# Patient Record
Sex: Male | Born: 1945 | Race: Black or African American | Hispanic: No | Marital: Single | Smoking: Never smoker
Health system: Southern US, Community
[De-identification: ages and names within clinical notes are randomized; demographics above are authoritative.]

## PROBLEM LIST (undated history)

## (undated) DIAGNOSIS — E785 Hyperlipidemia, unspecified: Secondary | ICD-10-CM

## (undated) DIAGNOSIS — I10 Essential (primary) hypertension: Secondary | ICD-10-CM

## (undated) DIAGNOSIS — E079 Disorder of thyroid, unspecified: Secondary | ICD-10-CM

## (undated) HISTORY — DX: Essential (primary) hypertension: I10

## (undated) HISTORY — DX: Hyperlipidemia, unspecified: E78.5

## (undated) HISTORY — DX: Disorder of thyroid, unspecified: E07.9

## (undated) HISTORY — PX: APPENDECTOMY: SHX54

---

## 1999-06-12 ENCOUNTER — Emergency Department (HOSPITAL_COMMUNITY): Admission: EM | Admit: 1999-06-12 | Discharge: 1999-06-12 | Payer: Self-pay | Admitting: Emergency Medicine

## 2007-09-07 ENCOUNTER — Ambulatory Visit: Payer: Self-pay

## 2012-03-17 ENCOUNTER — Ambulatory Visit (INDEPENDENT_AMBULATORY_CARE_PROVIDER_SITE_OTHER): Payer: BC Managed Care – PPO | Admitting: Physician Assistant

## 2012-03-17 VITALS — BP 135/86 | HR 70 | Temp 98.0°F | Resp 16 | Ht 68.5 in | Wt 158.8 lb

## 2012-03-17 DIAGNOSIS — H6120 Impacted cerumen, unspecified ear: Secondary | ICD-10-CM

## 2012-03-17 DIAGNOSIS — E785 Hyperlipidemia, unspecified: Secondary | ICD-10-CM

## 2012-03-17 DIAGNOSIS — I10 Essential (primary) hypertension: Secondary | ICD-10-CM

## 2012-03-17 MED ORDER — LISINOPRIL-HYDROCHLOROTHIAZIDE 20-25 MG PO TABS: 1.0000 | ORAL_TABLET | Freq: Every day | ORAL | Status: DC

## 2012-03-17 MED ORDER — ATORVASTATIN CALCIUM 40 MG PO TABS: ORAL_TABLET | ORAL | Status: DC

## 2012-03-17 NOTE — Progress Notes (Signed)
Patient ID: Brian Willis MRN: [id number], DOB: [birth year], 66 y.o. Date of Encounter: 03/17/2012, 1:33 PM  Primary Physician: No primary provider on file.  Chief Complaint: HTN  HPI: 66 y.o. year old male with history below presents for hypertension follow up. Doing well. No issues or complaints. Out of his Lisinopril/HCTZ 20/25 mg for 2 days now. Typically takes daily without issue or adverse effect. Trying to eat healthy. Exercising regularly. No CP, HA, visual changes, or focal deficits. Taking his Lipitor daily without issue. Not currently out of his Lipitor, but requests refill today. He is currently fasting.     Past Medical History  Diagnosis Date  . HTN (hypertension)   . Hyperlipidemia      Home Meds: Prior to Admission medications   Medication Sig Start Date End Date Taking? Authorizing Provider  atorvastatin (LIPITOR) 40 MG tablet Take 1/2 tab po daily 03/17/12   Raymon Mutton Somara Frymire, PA-C  lisinopril-hydrochlorothiazide (PRINZIDE,ZESTORETIC) 20-25 MG per tablet Take 1 tablet by mouth daily. 03/17/12 03/17/13  Sondra Barges, PA-C    Allergies: No Known Allergies  History   Social History  . Marital Status: Single    Spouse Name: N/A    Number of Children: N/A  . Years of Education: N/A   Occupational History  . Not on file.   Social History Main Topics  . Smoking status: Never Smoker   . Smokeless tobacco: Not on file  . Alcohol Use: Not on file  . Drug Use: Not on file  . Sexually Active: Not on file   Other Topics Concern  . Not on file   Social History Narrative  . No narrative on file     No family history on file.  Review of Systems: Constitutional: negative for chills, fever, night sweats, weight changes, or fatigue  HEENT: negative for vision changes, hearing loss, congestion, rhinorrhea, ST, epistaxis, or sinus pressure Cardiovascular: negative for chest pain, palpitations, or DOE Respiratory: negative for hemoptysis, wheezing, shortness of breath, or  cough Abdominal: negative for abdominal pain, nausea, vomiting, diarrhea, or constipation Dermatological: negative for rash Neurologic: negative for headache, dizziness, or syncope All other systems reviewed and are otherwise negative with the exception to those above and in the HPI.   Physical Exam: Blood pressure 135/86, pulse 70, temperature 98 F (36.7 C), temperature source Oral, resp. rate 16, height 5' 8.5" (1.74 m), weight 158 lb 12.8 oz (72.031 kg), SpO2 99.00%., Body mass index is 23.79 kg/(m^2). General: Well developed, well nourished, in no acute distress. Head: Normocephalic, atraumatic, eyes without discharge, sclera non-icteric, nares are without discharge. Right auditory canal with cerumen impaction. Left auditory canal clear, TM's are without perforation, pearly grey and translucent with reflective cone of light bilaterally. Oral cavity moist, posterior pharynx without exudate, erythema, peritonsillar abscess, or post nasal drip.  Neck: Supple. No thyromegaly. Full ROM. No lymphadenopathy. No carotid bruits. Lungs: Clear bilaterally to auscultation without wheezes, rales, or rhonchi. Breathing is unlabored. Heart: RRR with S1 S2. No murmurs, rubs, or gallops appreciated. No carotid bruits. Msk:  Strength and tone normal for age. Extremities/Skin: Warm and dry. No clubbing or cyanosis. No edema. No rashes or suspicious lesions. Distal pulses 2+ and equal bilaterally. Neuro: Alert and oriented X 3. Moves all extremities spontaneously. Gait is normal. CNII-XII grossly in tact. DTR 2+, cerebellar function intact. Rhomberg normal. Psych:  Responds to questions appropriately with a normal affect.   VCO: Right sided cerumen impaction flushed out by assistant. Cerumen impaction resolved.  TM without perf, erythema, or fluid behind.  Labs:  CMP and lipid panel pending. Patient is fasting.  ASSESSMENT AND PLAN:  66 y.o. year old male with HTN, hyperlipidemia, and right sided cerumen  impaction 1. HTN -Not at optimum control -Patient declines to increase his treatment today, he is insistent on this. Risks of HTN were discussed, he understands these risks and wishes to continue his current dosage -Refill Lisinopril/HCTZ 20/25 mg 1 po daily #90 RF 3  2. Hyperlipidemia  -Continue current medication -Refill Lipitor 40 mg 1/2 po daily #90 RF 3 -Await labs  3. Cerumen impaction -Ear lavage per above -Resolved  Signed, Eula Listen, PA-C 03/17/2012 1:33 PM

## 2012-03-18 LAB — COMPREHENSIVE METABOLIC PANEL
ALT: 33 U/L (ref 0–53)
AST: 29 U/L (ref 0–37)
Albumin: 4.4 g/dL (ref 3.5–5.2)
CO2: 24 mEq/L (ref 19–32)
Calcium: 10.5 mg/dL (ref 8.4–10.5)
Chloride: 109 mEq/L (ref 96–112)
Potassium: 4.8 mEq/L (ref 3.5–5.3)
Sodium: 141 mEq/L (ref 135–145)
Total Protein: 7.7 g/dL (ref 6.0–8.3)

## 2012-03-18 LAB — LIPID PANEL
LDL Cholesterol: 65 mg/dL (ref 0–99)
Triglycerides: 65 mg/dL (ref <150)
VLDL: 13 mg/dL (ref 0–40)

## 2013-01-08 ENCOUNTER — Ambulatory Visit (INDEPENDENT_AMBULATORY_CARE_PROVIDER_SITE_OTHER): Payer: BC Managed Care – PPO | Admitting: Emergency Medicine

## 2013-01-08 VITALS — BP 128/64 | HR 84 | Temp 98.7°F | Resp 16 | Ht 69.0 in | Wt 142.0 lb

## 2013-01-08 DIAGNOSIS — H1013 Acute atopic conjunctivitis, bilateral: Secondary | ICD-10-CM

## 2013-01-08 DIAGNOSIS — H47239 Glaucomatous optic atrophy, unspecified eye: Secondary | ICD-10-CM

## 2013-01-08 DIAGNOSIS — I10 Essential (primary) hypertension: Secondary | ICD-10-CM

## 2013-01-08 DIAGNOSIS — H547 Unspecified visual loss: Secondary | ICD-10-CM

## 2013-01-08 MED ORDER — AMLODIPINE BESYLATE 5 MG PO TABS: 5.0000 mg | ORAL_TABLET | Freq: Every day | ORAL | Status: DC

## 2013-01-08 MED ORDER — NAPHAZOLINE HCL 0.1 % OP SOLN: 1.0000 [drp] | Freq: Four times a day (QID) | OPHTHALMIC | Status: DC | PRN

## 2013-01-08 MED ORDER — CETIRIZINE HCL 10 MG PO TABS: 10.0000 mg | ORAL_TABLET | Freq: Every day | ORAL | Status: DC

## 2013-01-08 NOTE — Progress Notes (Signed)
  Subjective:    Patient ID: Brian Willis, male    DOB: December 01, 1945, 67 y.o.   MRN: [id number]  HPI Patient comes in today with complaints that his eyes are red and watering daily. He went to an optometrist Dr. Malachi Bonds and they prescribed eye drops- Tobramycin. He denies any changes in his home. He does not have any pets. He has not had this problem in the past. He denies any allergies. He also states that his nose is slightly congested and has a cough.    Review of Systems     Objective:   Physical Exam  Chemosis present in both eyes. TM clear. Throat clear. Nose clear. Lungs are clear.        Assessment & Plan:  1. Stop Lisinopril HCTZ and begin new blood pressure medication. 2. Start eye drops and Zyrtec to help with allergies. 3. If these symptoms do not resolve, referral to allergist.

## 2013-01-08 NOTE — Patient Instructions (Signed)
Stop Lisinopril You will be taking Zyrtec, one a day along with the prescription eye drops Call if not better by Thursday and I will make an appointment with an allergist.

## 2013-01-15 ENCOUNTER — Telehealth: Payer: Self-pay

## 2013-01-15 DIAGNOSIS — H1013 Acute atopic conjunctivitis, bilateral: Secondary | ICD-10-CM

## 2013-01-15 NOTE — Telephone Encounter (Signed)
F/u from ov - came in for his eyes - allergies   Cbn:  [phone]

## 2013-01-16 NOTE — Telephone Encounter (Signed)
Left message for him to call me back, plan is for referral to allergist if not better.

## 2013-01-17 NOTE — Telephone Encounter (Signed)
Referral being made for allergist.

## 2013-01-17 NOTE — Addendum Note (Signed)
Addended byCaffie Damme on: 01/17/2013 10:27 AM   Modules accepted: Orders

## 2013-01-21 ENCOUNTER — Telehealth: Payer: Self-pay

## 2013-01-21 NOTE — Telephone Encounter (Signed)
Pt is calling about a referral to an allergy doctor  Best number [phone]

## 2013-01-23 NOTE — Telephone Encounter (Signed)
Referral made on 01/17/13 did you not get this?

## 2013-01-23 NOTE — Telephone Encounter (Signed)
Patient aware we are making this referral

## 2013-01-23 NOTE — Addendum Note (Signed)
Addended byCaffie Damme on: 01/23/2013 02:39 PM   Modules accepted: Orders

## 2013-02-16 ENCOUNTER — Telehealth: Payer: Self-pay

## 2013-02-16 NOTE — Telephone Encounter (Signed)
Please advise 

## 2013-02-16 NOTE — Telephone Encounter (Signed)
Called him, left message for him to call me back.  

## 2013-02-16 NOTE — Telephone Encounter (Signed)
How is his blood pressure doing? It looks like Dr. Cleta Alberts d/c'ed lisinopril and started him on norvasc. If his BP is controlled and he is tolerating medication, may not need to change back.

## 2013-02-16 NOTE — Telephone Encounter (Signed)
PATIENT STATES THAT HE SAW AN ALLERGIST RECENTLY AND WAS TOLD THAT HIS BP MEDICATION IS NOT CAUSING HIS ALLERGY. PATIENT WANTS TO START BACK TAKING THAT MEDICATION. PLEASE CALL AT [phone].

## 2013-02-19 NOTE — Telephone Encounter (Signed)
LMOM to CB. 

## 2013-02-20 ENCOUNTER — Telehealth: Payer: Self-pay

## 2013-02-20 NOTE — Telephone Encounter (Signed)
RETURNING A CALL ABOUT MEDICATION. [phone]

## 2013-02-21 MED ORDER — AMLODIPINE BESYLATE 10 MG PO TABS: 10.0000 mg | ORAL_TABLET | Freq: Every day | ORAL | Status: DC

## 2013-02-21 NOTE — Telephone Encounter (Signed)
Patient states since he has been on Norvasc his BP was 140/90 on Amlodipine 5mg , he wants to know if he should increase this or go back on the Lisinopril.

## 2013-02-21 NOTE — Telephone Encounter (Signed)
Why don't we increase the Norvasc to 10mg  daily (so he can take 2 pills instead of 1 each day), let's see how his BP does with that before adding back the lisinopril.

## 2013-02-21 NOTE — Telephone Encounter (Signed)
Patient advised.

## 2013-02-23 ENCOUNTER — Telehealth: Payer: Self-pay

## 2013-02-23 NOTE — Telephone Encounter (Signed)
Agree, decrease back to 5mg .  If he has hives, swelling or lips or tongue, difficulty breathing, etc he needs to discontinue amlodipine immediately and go to the ER.  If BPs continue to be 140/90 or less, let's continue that dose of amlodipine.  If pressures are higher than that, let's have him RTC to discuss.

## 2013-02-23 NOTE — Telephone Encounter (Signed)
I did not call, unsure who did, message appears to be old, from last week. Called him, does he need anything? He states he has been itching from the increased dose of Amlodipine. Please advise. He is told to break them in half until I call him back, since 5 mg did not cause this.

## 2013-02-23 NOTE — Telephone Encounter (Signed)
Pt had a missed call and voicemail but the voicemail didn't say who to call. If someone could call him back

## 2013-02-24 NOTE — Telephone Encounter (Signed)
Called him, he is advised.

## 2013-03-29 ENCOUNTER — Telehealth: Payer: Self-pay

## 2013-03-29 NOTE — Telephone Encounter (Signed)
Please advise have you gotten any correspondence from the Allergist? I am a bit unsure what he may need. Do you want me to advise him to come back here to discuss?

## 2013-03-29 NOTE — Telephone Encounter (Signed)
Please call patient tell him I would be happy for him to come back and discuss this. Also find out what allergist he saw  and see if we can get those records.

## 2013-03-29 NOTE — Telephone Encounter (Signed)
Pt is calling because he was seen back in March and was sent over for his allergies to another office come to find out it wasn't his allergies It was actually something else and the Dr that he was sent to after that wants him to have a primary doctor to have to treat him for what he has He is wanting Dr Cleta Alberts to continue to treat him If someone could call him back at [phone]

## 2013-03-30 NOTE — Telephone Encounter (Signed)
Patient states he saw Opthalmologist and he has swelling back of his eyes. He has thyroid disease. He is advised to get the labs and come in for this. He will follow up with the ophthalmologist first. He will follow up here or with Dr Merla Riches. To you FYI

## 2013-03-30 NOTE — Telephone Encounter (Signed)
I called and spoke with patient. I told him to check with his ophthalmologist tomorrow and then if he needs referral to an endocrinologist we would be happy to make the referral.

## 2013-03-31 ENCOUNTER — Other Ambulatory Visit: Payer: Self-pay | Admitting: Physician Assistant

## 2013-04-06 ENCOUNTER — Telehealth: Payer: Self-pay

## 2013-04-11 NOTE — Telephone Encounter (Signed)
Called patient and he has allergy appt scheduled/ he wants more information wants to know where he is going, etc, can you call him?

## 2013-04-17 ENCOUNTER — Telehealth: Payer: Self-pay

## 2013-04-17 NOTE — Telephone Encounter (Signed)
Can you call him about the allergist referral?

## 2013-04-17 NOTE — Telephone Encounter (Signed)
FOR Brian Willis PT WOULD LIKE YOU TO CALL HIM BACK AND TELL HIM ABOUT THE REFERRAL YOU ARE MAKING FOR HIM PLEASE CALL [phone]

## 2013-04-18 ENCOUNTER — Telehealth: Payer: Self-pay

## 2013-04-18 NOTE — Telephone Encounter (Signed)
Pt called asking about a referral to an eye doctor and when I got on the phone with patient is was very rude and stated that amy was supposed to call him two days ago, and when I explained that amy was not here and that I did not see a  refrral to an eye doctor he continued to be rude and would not go into detail as to what he needed that since amy was not here he wasn't going into it with me and I told patient that I would put in phone message   Best number [phone]

## 2013-04-19 NOTE — Telephone Encounter (Signed)
Pt states that he now is at the eye doctor who states that he has an overactive thyroid and is now needing a referral to a thyroid doctor.  Advised him that him that we would need the records from the eye doctor.  He is seeing Dr. Nadean Corwin at [facility].  I called the office to advise them that we would need those records.  It looks like we initially sent this pt to the allergist and the allergist sent him to the eye doctor.  He stated that Dr. Loran Senters has done blood work.  The office stated that after his office visit today they would send over his records.

## 2013-04-24 ENCOUNTER — Other Ambulatory Visit: Payer: Self-pay | Admitting: Physician Assistant

## 2013-04-24 MED ORDER — LISINOPRIL-HYDROCHLOROTHIAZIDE 20-25 MG PO TABS: 1.0000 | ORAL_TABLET | Freq: Every day | ORAL | Status: DC

## 2013-04-24 NOTE — Telephone Encounter (Signed)
I spoke with patient about his BP meds - he was very hard to understand and I was not completely sure what the patient wanted.  He states the his Norvasc causes him to itch and has since he started on it in March and the eye doctor states that his red eye was not from his lisinopril and he would like to start back on it.  I sent the patient in a 30d supply of Lisinopril and told him he would need an ov before those meds run out.  He stated understanding.

## 2013-04-26 ENCOUNTER — Telehealth: Payer: Self-pay | Admitting: *Deleted

## 2013-04-26 DIAGNOSIS — E0500 Thyrotoxicosis with diffuse goiter without thyrotoxic crisis or storm: Secondary | ICD-10-CM | POA: Diagnosis not present

## 2013-04-26 NOTE — Telephone Encounter (Signed)
Spoke with Terri at [facility] and they referred pt to endocrinologist.

## 2013-05-17 DIAGNOSIS — H109 Unspecified conjunctivitis: Secondary | ICD-10-CM | POA: Diagnosis not present

## 2013-05-17 DIAGNOSIS — E0500 Thyrotoxicosis with diffuse goiter without thyrotoxic crisis or storm: Secondary | ICD-10-CM | POA: Diagnosis not present

## 2013-05-17 DIAGNOSIS — H169 Unspecified keratitis: Secondary | ICD-10-CM | POA: Diagnosis not present

## 2013-05-17 DIAGNOSIS — E0590 Thyrotoxicosis, unspecified without thyrotoxic crisis or storm: Secondary | ICD-10-CM | POA: Diagnosis not present

## 2013-05-31 DIAGNOSIS — E0500 Thyrotoxicosis with diffuse goiter without thyrotoxic crisis or storm: Secondary | ICD-10-CM | POA: Diagnosis not present

## 2013-05-31 DIAGNOSIS — H0520 Unspecified exophthalmos: Secondary | ICD-10-CM | POA: Diagnosis not present

## 2013-06-06 ENCOUNTER — Ambulatory Visit (INDEPENDENT_AMBULATORY_CARE_PROVIDER_SITE_OTHER): Payer: BC Managed Care – PPO | Admitting: Family Medicine

## 2013-06-06 VITALS — BP 116/72 | HR 75 | Temp 98.0°F | Resp 16 | Ht 68.5 in | Wt 124.6 lb

## 2013-06-06 DIAGNOSIS — I10 Essential (primary) hypertension: Secondary | ICD-10-CM

## 2013-06-06 DIAGNOSIS — R350 Frequency of micturition: Secondary | ICD-10-CM

## 2013-06-06 DIAGNOSIS — R319 Hematuria, unspecified: Secondary | ICD-10-CM

## 2013-06-06 DIAGNOSIS — E785 Hyperlipidemia, unspecified: Secondary | ICD-10-CM | POA: Diagnosis not present

## 2013-06-06 DIAGNOSIS — R3989 Other symptoms and signs involving the genitourinary system: Secondary | ICD-10-CM

## 2013-06-06 DIAGNOSIS — R82998 Other abnormal findings in urine: Secondary | ICD-10-CM | POA: Diagnosis not present

## 2013-06-06 DIAGNOSIS — Z79899 Other long term (current) drug therapy: Secondary | ICD-10-CM | POA: Diagnosis not present

## 2013-06-06 DIAGNOSIS — R7989 Other specified abnormal findings of blood chemistry: Secondary | ICD-10-CM | POA: Diagnosis not present

## 2013-06-06 LAB — POCT UA - MICROSCOPIC ONLY
Bacteria, U Microscopic: NEGATIVE
Crystals, Ur, HPF, POC: NEGATIVE
Epithelial cells, urine per micros: NEGATIVE
Mucus, UA: NEGATIVE

## 2013-06-06 LAB — POCT URINALYSIS DIPSTICK
Ketones, UA: NEGATIVE
Protein, UA: NEGATIVE
Spec Grav, UA: 1.015
pH, UA: 5.5

## 2013-06-06 MED ORDER — LISINOPRIL-HYDROCHLOROTHIAZIDE 20-25 MG PO TABS: 1.0000 | ORAL_TABLET | Freq: Every day | ORAL | Status: DC

## 2013-06-06 NOTE — Progress Notes (Signed)
Subjective:    Patient ID: Brian Willis, male    DOB: 07-21-1946, 67 y.o.   MRN: [id number]  HPI Brian Willis is a 68 y.o. male PCP: [facility]- no specific provider.    Here for med refill and additional concern of urinary frequency.   HTN - taking lisinopril/hct 20/25mg  qd. No missed doses. Outside BP's: 130-140/80's at times.  Stress test in 2008 - was told that was normal.    Hyperlipidemia - fasting today.  Taking lipitor QD.  No new muscle aches.   Seen in March for watering eyes, sent to allergist, btu then seen by opthalmologist ([facility] in [facility]) for the eye redness and watering - diagnosed with overactive thyroid - followed by Dr Kevan Ny in [place] - this Thursday at 9:00. Taking methimazole twice per day.   Urinary frequency - started a couple of weeks ago. A little worse a few days ago,  Slight burning at the end of urination. Feels better today. No fever. No abd pain.  Slight orange color of urine. No tenesmus. No known hx of prostate problems. Later in history - states still having frequency and burning at end of urination.  Not sexually active. Orange urine same today as yesterday - did not seem to be blood or clots in the urine.   Unable to obtain paper chart- not located during OV.    Review of Systems  Constitutional: Positive for unexpected weight change (fluctuating with thyroid. ). Negative for fatigue.  Eyes: Negative for visual disturbance.  Respiratory: Negative for cough, chest tightness and shortness of breath.   Cardiovascular: Positive for palpitations (has hx of heart racing with overactive thyroid. ). Negative for chest pain and leg swelling.  Gastrointestinal: Negative for abdominal pain and blood in stool.  Genitourinary: Positive for frequency and hematuria (orange urine. ).       Orange urine.   Neurological: Negative for dizziness, light-headedness and headaches.       Objective:   Physical Exam  Vitals reviewed. Constitutional: He is oriented  to person, place, and time. He appears well-developed and well-nourished.  HENT:  Head: Normocephalic and atraumatic.  Eyes: EOM are normal. Pupils are equal, round, and reactive to light.  Neck: No JVD present. Carotid bruit is not present.  Cardiovascular: Normal rate, regular rhythm and normal heart sounds.   No murmur heard. Pulmonary/Chest: Effort normal and breath sounds normal. He has no rales.  Abdominal: Soft. There is no tenderness.  Musculoskeletal: He exhibits no edema.  Neurological: He is alert and oriented to person, place, and time.  Skin: Skin is warm and dry.  Psychiatric: His speech is normal and behavior is normal. His affect is labile.  Labile during history. Calmer demeanor at assessment/plan.      Results for orders placed in visit on 06/06/13  POCT UA - MICROSCOPIC ONLY      Result Value Range   WBC, Ur, HPF, POC neg     RBC, urine, microscopic 0-3     Bacteria, U Microscopic neg     Mucus, UA neg     Epithelial cells, urine per micros neg     Crystals, Ur, HPF, POC neg     Casts, Ur, LPF, POC neg     Yeast, UA neg    POCT URINALYSIS DIPSTICK      Result Value Range   Color, UA yellow     Clarity, UA clear     Glucose, UA neg     Bilirubin, UA  neg     Ketones, UA neg     Spec Grav, UA 1.015     Blood, UA small     pH, UA 5.5     Protein, UA neg     Urobilinogen, UA 0.2     Nitrite, UA neg     Leukocytes, UA Negative           Assessment & Plan:  Brian Willis is a 67 y.o. male Urine discoloration - Plan: POCT UA - Microscopic Only, POCT urinalysis dipstick  Hypertension - Plan: Comprehensive metabolic panel, Lipid panel  Hyperlipidemia - Plan: Comprehensive metabolic panel, Lipid panel  Urinary frequency - Plan: PSA, Medicare  HTN - controlled at present.  Refilled meds for 6 months, labs pending.   Hyperlipidemia - tolerating statin. Continue Lipitor at current dose, labs pending. Has refills available until 2015.   Urinary frequency  - overall reassuring from infection standpoint, but with  small amount of hematuria, although initial difficult history - it appears his symptoms are improving.  Will check PSA, urine sent for culture, BMP to r/o nephritis as now on Methimazole. Discussed Cipro for possible prostatitis, but he would like to wait on the results of PSA and BMP first. Also discussed urology eval, but this was also declined at this time. rtc   Patient appeared irritated during history, and felt like he is "repeating himself". And stated "its in the chart".   Difficult history to obtain, had to ask few times for answers to questions above, and to clarify history as well as workup as above.  I told him that I was trying to get the necessary information to treat his symotoms and refill his medicines.  I offered multiple times to have a different provider see him today if he would feel more comfortable, which he refused. I also recommended establishing one provider here as his primary provider to help with continuity.  Has apparently been foolowed by Dr. Merla Riches prior and had physical in 2012- encouraged to schedule this.   My assistant was present throughout ov.

## 2013-06-06 NOTE — Patient Instructions (Signed)
You should receive a call or letter about your lab results within the next week to 10 days, but likely sooner.  Return to the clinic or go to the nearest emergency room if any of your symptoms worsen or new symptoms occur., and if discolored urine persists - recommend urologist evaluation.  Continue follow up as planned for your thyroid.  schedule physical in the next 6 months. Call main number to schedule this.

## 2013-06-07 ENCOUNTER — Ambulatory Visit (INDEPENDENT_AMBULATORY_CARE_PROVIDER_SITE_OTHER): Payer: BC Managed Care – PPO | Admitting: Family Medicine

## 2013-06-07 VITALS — BP 112/74 | HR 82 | Temp 99.2°F | Resp 16

## 2013-06-07 DIAGNOSIS — E785 Hyperlipidemia, unspecified: Secondary | ICD-10-CM | POA: Diagnosis not present

## 2013-06-07 DIAGNOSIS — I10 Essential (primary) hypertension: Secondary | ICD-10-CM | POA: Diagnosis not present

## 2013-06-07 DIAGNOSIS — R82998 Other abnormal findings in urine: Secondary | ICD-10-CM | POA: Diagnosis not present

## 2013-06-07 DIAGNOSIS — Z79899 Other long term (current) drug therapy: Secondary | ICD-10-CM | POA: Diagnosis not present

## 2013-06-07 DIAGNOSIS — R7989 Other specified abnormal findings of blood chemistry: Secondary | ICD-10-CM | POA: Diagnosis not present

## 2013-06-07 DIAGNOSIS — R350 Frequency of micturition: Secondary | ICD-10-CM | POA: Diagnosis not present

## 2013-06-07 DIAGNOSIS — R319 Hematuria, unspecified: Secondary | ICD-10-CM | POA: Diagnosis not present

## 2013-06-07 LAB — LIPID PANEL
Cholesterol: 183 mg/dL (ref 0–200)
Triglycerides: 101 mg/dL (ref <150)
VLDL: 20 mg/dL (ref 0–40)

## 2013-06-07 LAB — POCT URINALYSIS DIPSTICK
Nitrite, UA: NEGATIVE
Protein, UA: NEGATIVE
pH, UA: 5.5

## 2013-06-07 LAB — COMPREHENSIVE METABOLIC PANEL
ALT: 229 U/L — ABNORMAL HIGH (ref 0–53)
AST: 94 U/L — ABNORMAL HIGH (ref 0–37)
BUN: 8 mg/dL (ref 6–23)
CO2: 23 mEq/L (ref 19–32)
CO2: 21 mEq/L (ref 19–32)
Calcium: 9.4 mg/dL (ref 8.4–10.5)
Chloride: 102 mEq/L (ref 96–112)
Creat: 0.96 mg/dL (ref 0.50–1.35)
Creat: 1.44 mg/dL — ABNORMAL HIGH (ref 0.50–1.35)
Glucose, Bld: 87 mg/dL (ref 70–99)
Potassium: 3.9 mEq/L (ref 3.5–5.3)
Sodium: 138 mEq/L (ref 135–145)
Sodium: 136 mEq/L (ref 135–145)
Total Bilirubin: 3 mg/dL — ABNORMAL HIGH (ref 0.3–1.2)
Total Protein: 6.9 g/dL (ref 6.0–8.3)
Total Protein: 6.6 g/dL (ref 6.0–8.3)

## 2013-06-07 NOTE — Patient Instructions (Signed)
Do not take any more methimazole until advised to do so as it may be affecting your liver. Return to the clinic or go to the nearest emergency room if any of your symptoms worsen or new symptoms occur. You should receive a call or letter about your lab results by tomorrow.  Keep your appointment with your specialist tomorrow.

## 2013-06-07 NOTE — Progress Notes (Addendum)
Subjective:    Patient ID: Brian Willis, male    DOB: 1946-04-28, 67 y.o.   MRN: [id number]  HPI Brian Willis is a 67 y.o. male See office visit yesterday.  Recently diagnosed with overactive thyroid - followed by eye doctor, specialist appt in [facility] - appt tomorrow at 9am  taing methimazole twice per day since about the 19th of this month - followed by [facility] on [address] - Dr. Allena Katz - [phone].   Notes yesterday to have uinary frequency - started a couple of weeks ago, but also noticed orange color to urine past few days.  U/a yesterday without bili or apparent infection, but checked PSA and CMP for refill of antihypertensives.   elevated LFT's including bilirubin noted  - called to return to clinic tonight for recheck. See phone message.  Results for orders placed in visit on 06/06/13  COMPREHENSIVE METABOLIC PANEL      Result Value Range   Sodium 138  135 - 145 mEq/L   Potassium 3.7  3.5 - 5.3 mEq/L   Chloride 101  96 - 112 mEq/L   CO2 23  19 - 32 mEq/L   Glucose, Bld 87  70 - 99 mg/dL   BUN 8  6 - 23 mg/dL   Creat 4.69  6.29 - 5.28 mg/dL   Total Bilirubin 3.0 (*) 0.3 - 1.2 mg/dL   Alkaline Phosphatase 268 (*) 39 - 117 U/L   AST 130 (*) 0 - 37 U/L   ALT 319 (*) 0 - 53 U/L   Total Protein 6.9  6.0 - 8.3 g/dL   Albumin 4.1  3.5 - 5.2 g/dL   Calcium 9.6  8.4 - 41.3 mg/dL  LIPID PANEL      Result Value Range   Cholesterol 183  0 - 200 mg/dL   Triglycerides 244  <010 mg/dL   HDL 76  >27 mg/dL   Total CHOL/HDL Ratio 2.4     VLDL 20  0 - 40 mg/dL   LDL Cholesterol 87  0 - 99 mg/dL  PSA, MEDICARE      Result Value Range   PSA 1.09  <=4.00 ng/mL  POCT UA - MICROSCOPIC ONLY      Result Value Range   WBC, Ur, HPF, POC neg     RBC, urine, microscopic 0-3     Bacteria, U Microscopic neg     Mucus, UA neg     Epithelial cells, urine per micros neg     Crystals, Ur, HPF, POC neg     Casts, Ur, LPF, POC neg     Yeast, UA neg    POCT URINALYSIS DIPSTICK   Result Value Range   Color, UA yellow     Clarity, UA clear     Glucose, UA neg     Bilirubin, UA neg     Ketones, UA neg     Spec Grav, UA 1.015     Blood, UA small     pH, UA 5.5     Protein, UA neg     Urobilinogen, UA 0.2     Nitrite, UA neg     Leukocytes, UA Negative     Dark orange urine every time he takes Tapazole - past few days.  Does not remember last liver test.  No abdominal  Pain, no nausea or vomiting.  No change in skin color or yellowing of the eyes.  No recent travel, no raw or undercooked foods. No alcohol. Does take  herbal supplement - men's vitamin, has been on this awhile -- told by Dr. Allena Katz this was ok. Last dose of methimazole.    Review of Systems  Constitutional: Negative for fever and chills.  Eyes: Negative for visual disturbance.  Gastrointestinal: Negative for nausea, vomiting and abdominal pain.  Skin: Negative for color change and rash.       Objective:   Physical Exam  Constitutional: He is oriented to person, place, and time. He appears well-developed and well-nourished.  HENT:  Head: Normocephalic and atraumatic.  Eyes: EOM are normal. Pupils are equal, round, and reactive to light.  Neck: No JVD present. Carotid bruit is not present.  Cardiovascular: Normal rate, regular rhythm and normal heart sounds.   No murmur heard. Pulmonary/Chest: Effort normal and breath sounds normal. He has no rales.  Abdominal: Soft. Bowel sounds are normal. He exhibits no distension. There is no tenderness. There is no rebound, no guarding, no CVA tenderness and negative Murphy's sign.  Musculoskeletal: He exhibits no edema.  Neurological: He is alert and oriented to person, place, and time.  Skin: Skin is warm and dry.  No jaundice, no scleral icterus.   Psychiatric: He has a normal mood and affect.   Results for orders placed in visit on 06/07/13  POCT URINALYSIS DIPSTICK      Result Value Range   Color, UA dk yellow     Clarity, UA clear     Glucose,  UA neg     Bilirubin, UA small     Ketones, UA trace     Spec Grav, UA 1.025     Blood, UA sm     pH, UA 5.5     Protein, UA neg     Urobilinogen, UA 2.0     Nitrite, UA neg     Leukocytes, UA Negative         Assessment & Plan:  Brian Willis is a 67 y.o. male Urinary frequency - Plan: POCT urinalysis dipstick  Elevated LFTs - Plan: Comprehensive metabolic panel, POCT urinalysis dipstick  High risk medication use - Plan: Comprehensive metabolic panel, POCT urinalysis dipstick  Recent diagnosis of hyperthyroidism, started on Methimazole approximately 11 days ago, now with elevated LFT's.  Abdomen NT on exam, no apparent jaundice.  Stat CMP checked tonight. Call placed to Dr. Eliane Decree office - Dr. Fanny Dance? on call.  Attempted call twice through answering service without response over course of 1 hour.  Will hold methimazole as of tonight until repeat CMP results and discussion with endocrine.  Hep B and C tests ordered.  Cell: [id number].    Addendum: Stat labs called at 2230: Results for orders placed in visit on 06/07/13  COMPREHENSIVE METABOLIC PANEL      Result Value Range   Sodium 136  135 - 145 mEq/L   Potassium 3.9  3.5 - 5.3 mEq/L   Chloride 102  96 - 112 mEq/L   CO2 21  19 - 32 mEq/L   Glucose, Bld 111 (*) 70 - 99 mg/dL   BUN 17  6 - 23 mg/dL   Creat 1.61 (*) 0.96 - 1.35 mg/dL   Total Bilirubin 1.5 (*) 0.3 - 1.2 mg/dL   Alkaline Phosphatase 318 (*) 39 - 117 U/L   AST 94 (*) 0 - 37 U/L   ALT 229 (*) 0 - 53 U/L   Total Protein 6.6  6.0 - 8.3 g/dL   Albumin 4.0  3.5 - 5.2 g/dL   Calcium 9.4  8.4 - 10.5 mg/dL  HEPATITIS C ANTIBODY      Result Value Range   HCV Ab    NEGATIVE  HEPATITIS B SURFACE ANTIBODY      Result Value Range   Hep B S Ab      HEPATITIS B SURFACE ANTIGEN      Result Value Range   Hepatitis B Surface Ag      POCT URINALYSIS DIPSTICK      Result Value Range   Color, UA dk yellow     Clarity, UA clear     Glucose, UA neg     Bilirubin, UA  small     Ketones, UA trace     Spec Grav, UA 1.025     Blood, UA sm     pH, UA 5.5     Protein, UA neg     Urobilinogen, UA 2.0     Nitrite, UA neg     Leukocytes, UA Negative     Left message on patient's cell - will call to discuss labs further tomorrow. Bilirubin down from 3.0 to 1.5, Alk phos 268- 318, AST 130-94 , ALT 319 - 229, but also has had increase in creatinine 0.96 to 1.44. Will discuss with endocrinologist, but to hold methimazole as above for now.

## 2013-06-08 ENCOUNTER — Other Ambulatory Visit: Payer: Self-pay | Admitting: Family Medicine

## 2013-06-08 ENCOUNTER — Telehealth: Payer: Self-pay

## 2013-06-08 DIAGNOSIS — I10 Essential (primary) hypertension: Secondary | ICD-10-CM

## 2013-06-08 DIAGNOSIS — R748 Abnormal levels of other serum enzymes: Secondary | ICD-10-CM

## 2013-06-08 DIAGNOSIS — E0500 Thyrotoxicosis with diffuse goiter without thyrotoxic crisis or storm: Secondary | ICD-10-CM | POA: Diagnosis not present

## 2013-06-08 DIAGNOSIS — H539 Unspecified visual disturbance: Secondary | ICD-10-CM | POA: Diagnosis not present

## 2013-06-08 DIAGNOSIS — R17 Unspecified jaundice: Secondary | ICD-10-CM

## 2013-06-08 DIAGNOSIS — H5789 Other specified disorders of eye and adnexa: Secondary | ICD-10-CM | POA: Diagnosis not present

## 2013-06-08 DIAGNOSIS — R7989 Other specified abnormal findings of blood chemistry: Secondary | ICD-10-CM

## 2013-06-08 LAB — HEPATITIS B SURFACE ANTIGEN: Hepatitis B Surface Ag: NEGATIVE

## 2013-06-08 LAB — URINE CULTURE: Organism ID, Bacteria: NO GROWTH

## 2013-06-08 MED ORDER — HYDROCHLOROTHIAZIDE 25 MG PO TABS: 25.0000 mg | ORAL_TABLET | Freq: Every day | ORAL | Status: DC

## 2013-06-09 ENCOUNTER — Other Ambulatory Visit: Payer: Self-pay | Admitting: Family Medicine

## 2013-06-09 ENCOUNTER — Other Ambulatory Visit (INDEPENDENT_AMBULATORY_CARE_PROVIDER_SITE_OTHER): Payer: BC Managed Care – PPO

## 2013-06-09 ENCOUNTER — Ambulatory Visit (HOSPITAL_COMMUNITY)
Admission: RE | Admit: 2013-06-09 | Discharge: 2013-06-09 | Disposition: A | Discharge status: Home / Self Care | Payer: Medicare Other | Source: Ambulatory Visit | Attending: Family Medicine | Admitting: Family Medicine

## 2013-06-09 VITALS — BP 140/80 | HR 73 | Temp 98.0°F | Resp 16

## 2013-06-09 DIAGNOSIS — R17 Unspecified jaundice: Secondary | ICD-10-CM

## 2013-06-09 DIAGNOSIS — R748 Abnormal levels of other serum enzymes: Secondary | ICD-10-CM

## 2013-06-09 DIAGNOSIS — N281 Cyst of kidney, acquired: Secondary | ICD-10-CM | POA: Insufficient documentation

## 2013-06-09 DIAGNOSIS — R7989 Other specified abnormal findings of blood chemistry: Secondary | ICD-10-CM

## 2013-06-10 LAB — COMPREHENSIVE METABOLIC PANEL
ALT: 139 U/L — ABNORMAL HIGH (ref 0–53)
CO2: 27 mEq/L (ref 19–32)
Potassium: 4 mEq/L (ref 3.5–5.3)
Sodium: 139 mEq/L (ref 135–145)
Total Bilirubin: 1.4 mg/dL — ABNORMAL HIGH (ref 0.3–1.2)
Total Protein: 6.7 g/dL (ref 6.0–8.3)

## 2013-06-20 DIAGNOSIS — E0500 Thyrotoxicosis with diffuse goiter without thyrotoxic crisis or storm: Secondary | ICD-10-CM | POA: Diagnosis not present

## 2013-06-23 DIAGNOSIS — E0500 Thyrotoxicosis with diffuse goiter without thyrotoxic crisis or storm: Secondary | ICD-10-CM | POA: Diagnosis not present

## 2013-07-05 DIAGNOSIS — E0590 Thyrotoxicosis, unspecified without thyrotoxic crisis or storm: Secondary | ICD-10-CM | POA: Diagnosis not present

## 2013-07-05 DIAGNOSIS — H109 Unspecified conjunctivitis: Secondary | ICD-10-CM | POA: Diagnosis not present

## 2013-07-05 DIAGNOSIS — H40009 Preglaucoma, unspecified, unspecified eye: Secondary | ICD-10-CM | POA: Diagnosis not present

## 2013-07-05 DIAGNOSIS — E0500 Thyrotoxicosis with diffuse goiter without thyrotoxic crisis or storm: Secondary | ICD-10-CM | POA: Diagnosis not present

## 2013-07-07 DIAGNOSIS — E0500 Thyrotoxicosis with diffuse goiter without thyrotoxic crisis or storm: Secondary | ICD-10-CM | POA: Diagnosis not present

## 2013-07-13 NOTE — Telephone Encounter (Signed)
Close  

## 2013-07-18 DIAGNOSIS — E0500 Thyrotoxicosis with diffuse goiter without thyrotoxic crisis or storm: Secondary | ICD-10-CM | POA: Diagnosis not present

## 2013-07-21 DIAGNOSIS — E0500 Thyrotoxicosis with diffuse goiter without thyrotoxic crisis or storm: Secondary | ICD-10-CM | POA: Diagnosis not present

## 2013-07-26 ENCOUNTER — Encounter: Payer: Self-pay | Admitting: Radiology

## 2013-07-26 DIAGNOSIS — N400 Enlarged prostate without lower urinary tract symptoms: Secondary | ICD-10-CM | POA: Insufficient documentation

## 2013-07-26 DIAGNOSIS — E0500 Thyrotoxicosis with diffuse goiter without thyrotoxic crisis or storm: Secondary | ICD-10-CM | POA: Insufficient documentation

## 2013-07-27 DIAGNOSIS — Z23 Encounter for immunization: Secondary | ICD-10-CM | POA: Diagnosis not present

## 2013-07-28 DIAGNOSIS — E0500 Thyrotoxicosis with diffuse goiter without thyrotoxic crisis or storm: Secondary | ICD-10-CM | POA: Diagnosis not present

## 2013-08-08 DIAGNOSIS — H0520 Unspecified exophthalmos: Secondary | ICD-10-CM | POA: Diagnosis not present

## 2013-08-08 DIAGNOSIS — E0500 Thyrotoxicosis with diffuse goiter without thyrotoxic crisis or storm: Secondary | ICD-10-CM | POA: Diagnosis not present

## 2013-08-08 DIAGNOSIS — H547 Unspecified visual loss: Secondary | ICD-10-CM | POA: Diagnosis not present

## 2013-08-17 DIAGNOSIS — E0500 Thyrotoxicosis with diffuse goiter without thyrotoxic crisis or storm: Secondary | ICD-10-CM | POA: Diagnosis not present

## 2013-08-31 DIAGNOSIS — E0500 Thyrotoxicosis with diffuse goiter without thyrotoxic crisis or storm: Secondary | ICD-10-CM | POA: Diagnosis not present

## 2013-09-05 DIAGNOSIS — E0500 Thyrotoxicosis with diffuse goiter without thyrotoxic crisis or storm: Secondary | ICD-10-CM | POA: Diagnosis not present

## 2013-09-07 DIAGNOSIS — E0500 Thyrotoxicosis with diffuse goiter without thyrotoxic crisis or storm: Secondary | ICD-10-CM | POA: Diagnosis not present

## 2013-09-11 DIAGNOSIS — E0500 Thyrotoxicosis with diffuse goiter without thyrotoxic crisis or storm: Secondary | ICD-10-CM | POA: Diagnosis not present

## 2013-10-09 DIAGNOSIS — E0500 Thyrotoxicosis with diffuse goiter without thyrotoxic crisis or storm: Secondary | ICD-10-CM | POA: Diagnosis not present

## 2013-10-16 DIAGNOSIS — E0500 Thyrotoxicosis with diffuse goiter without thyrotoxic crisis or storm: Secondary | ICD-10-CM | POA: Diagnosis not present

## 2013-10-18 ENCOUNTER — Telehealth: Payer: Self-pay

## 2013-10-18 DIAGNOSIS — E0500 Thyrotoxicosis with diffuse goiter without thyrotoxic crisis or storm: Secondary | ICD-10-CM | POA: Diagnosis not present

## 2013-10-18 NOTE — Telephone Encounter (Signed)
Left message for him to call me back.  

## 2013-10-18 NOTE — Telephone Encounter (Signed)
Spoke with patient and he is seeing an eye specialist.  The eye doctor put him on prednisone.  His blood pressure was 155/90 yesterday.  On Monday it was 177/94.  He will come off of prednisone in a week.  Wants to know if you can increase bp med?  I informed him that he might need to come in for visit.  He is insistent that he only wants to increase his dose for another week or two until he comes off prednisone.  Please advise

## 2013-10-18 NOTE — Addendum Note (Signed)
Addended byCaffie Damme on: 10/18/2013 04:40 PM   Modules accepted: Orders

## 2013-10-18 NOTE — Telephone Encounter (Signed)
See prior note - please clarify current prescriptions as well, to make sure I don't add something that may cause interactions.

## 2013-10-18 NOTE — Telephone Encounter (Signed)
Pt states that Dr.Yates his eye specialist in [place], stated that he needed to increase the dosage on his blood pressure medication due to his blood pressure being raised by the prednisone he was prescribed.  Best# [phone]  [facility] on [address] in [place]

## 2013-10-18 NOTE — Telephone Encounter (Signed)
Pt states that Dr.Yates his eye specialist in [place], stated that he needed to increase the dosage on his blood pressure medication due to his blood pressure being raised by the prednisone he was prescribed.  °Best# [phone]  °[facility] on [address] in [place] ° °

## 2013-10-18 NOTE — Telephone Encounter (Addendum)
He is on the HCTZ only. He indicates he is also on thyroid medications but this is all he is taking.

## 2013-10-18 NOTE — Telephone Encounter (Signed)
Pended the 2 week supply of the Lisinopril, as this appears to be the plan. He states he will come in to see [place] soon for this also.

## 2013-10-18 NOTE — Telephone Encounter (Signed)
He had been on combo of lisinopril and HCTZ prior - up to 20/25mg  of lisinopril/hct. It appears he is only on HCTZ at present, so at max dose of this one. If he is only taking HCTZ 25mg  qd, could add lisinopril 10mg  qd for next 2 weeks, but may need to be on combo med long term, as required this in past.

## 2013-10-19 ENCOUNTER — Telehealth: Payer: Self-pay | Admitting: Radiology

## 2013-10-19 MED ORDER — LISINOPRIL 10 MG PO TABS: 10.0000 mg | ORAL_TABLET | Freq: Every day | ORAL | Status: DC

## 2013-10-19 NOTE — Telephone Encounter (Signed)
Thank you he will keep [place] advised.

## 2013-10-19 NOTE — Telephone Encounter (Signed)
Med encounter

## 2013-10-19 NOTE — Telephone Encounter (Signed)
Patient called again, [facility] does not have his Lisinopril called pharmacist and they do have this, she will let patient know, he is VERY upset.

## 2013-10-19 NOTE — Telephone Encounter (Signed)
I sent in the 2 week supply for him, per previous notes.

## 2013-10-19 NOTE — Telephone Encounter (Signed)
Great. Thank you for your help. He may need to be on lisinopril beyond that time, but he can keep an eye on his blood pressures after the prednisone is completed.  If blood pressures elevate after coming off lisinopril (only on HCTZ) - may need to continue lisinopril and I can provide further refills for next month or two. Thanks!

## 2013-10-26 DIAGNOSIS — H532 Diplopia: Secondary | ICD-10-CM | POA: Diagnosis not present

## 2013-10-26 DIAGNOSIS — E0500 Thyrotoxicosis with diffuse goiter without thyrotoxic crisis or storm: Secondary | ICD-10-CM | POA: Diagnosis not present

## 2013-11-07 DIAGNOSIS — E0500 Thyrotoxicosis with diffuse goiter without thyrotoxic crisis or storm: Secondary | ICD-10-CM | POA: Diagnosis not present

## 2013-12-05 DIAGNOSIS — E0500 Thyrotoxicosis with diffuse goiter without thyrotoxic crisis or storm: Secondary | ICD-10-CM | POA: Diagnosis not present

## 2013-12-15 ENCOUNTER — Ambulatory Visit (INDEPENDENT_AMBULATORY_CARE_PROVIDER_SITE_OTHER): Payer: Medicare Other | Admitting: Internal Medicine

## 2013-12-15 VITALS — BP 138/88 | HR 78 | Temp 98.5°F | Resp 17 | Ht 68.5 in | Wt 138.0 lb

## 2013-12-15 DIAGNOSIS — E0500 Thyrotoxicosis with diffuse goiter without thyrotoxic crisis or storm: Secondary | ICD-10-CM

## 2013-12-15 DIAGNOSIS — I10 Essential (primary) hypertension: Secondary | ICD-10-CM

## 2013-12-15 MED ORDER — LISINOPRIL 10 MG PO TABS: 10.0000 mg | ORAL_TABLET | Freq: Every day | ORAL | Status: DC

## 2013-12-15 MED ORDER — HYDROCHLOROTHIAZIDE 25 MG PO TABS: 25.0000 mg | ORAL_TABLET | Freq: Every day | ORAL | Status: DC

## 2013-12-15 NOTE — Progress Notes (Signed)
Subjective:    Patient ID: Brian Willis, male    DOB: Jul 02, 1946, 68 y.o.   MRN: [id number] This chart was scribed for Brian Sia, MD by Valera Castle, ED Scribe. This patient was seen in room 10 and the patient's care was started at 11:57 AM.  Chief Complaint  Patient presents with   Medication Refill    Hctz, lisinopril     HPI Brian Willis is a 68 y.o. male Presents for medication refill of HCTZ and Lisinopril.  He states he has been diagnosed with Grave's disease. He states he was told he was going to be slowly taken off Prednisone for next 3 months, will have increase in blood pressure. He reports his thyroid is being checked up on.   PCP - No PCP Per Patient but has been Dr patel in [facility] tho he bounces around for med refills Says labs have been recent there re graves anf htn amd is resp to pred taper  Patient Active Problem List   Diagnosis Date Noted   Graves disease 07/26/2013   BPH (benign prostatic hyperplasia) 07/26/2013   HTN (hypertension)    Hyperlipidemia    Past Surgical History  Procedure Laterality Date   Appendectomy      Prior to Admission medications   Medication Sig Start Date End Date Taking? Authorizing Provider  atorvastatin (LIPITOR) 40 MG tablet TAKE 1/2 TABLET BY MOUTH EVERY DAY 04/24/13  Yes Morrell Riddle, PA-C  hydrochlorothiazide (HYDRODIURIL) 25 MG tablet Take 1 tablet (25 mg total) by mouth daily. 06/08/13  Yes Shade Flood, MD  lisinopril (PRINIVIL,ZESTRIL) 10 MG tablet Take 1 tablet (10 mg total) by mouth daily. 10/19/13  Yes Shade Flood, MD  predniSONE (DELTASONE) 10 MG tablet Take 10 mg by mouth daily with breakfast.   Yes Historical Provider, MD  cetirizine (ZYRTEC) 10 MG tablet Take 1 tablet (10 mg total) by mouth daily. 01/08/13   Collene Gobble, MD  naphazoline (NAPHCON) 0.1 % ophthalmic solution Place 1 drop into both eyes 4 (four) times daily as needed. 01/08/13   Collene Gobble, MD   Review of Systems  Constitutional:  Negative for fever.      Objective:   Physical Exam  Nursing note and vitals reviewed. Constitutional: He is oriented to person, place, and time. He appears well-developed and well-nourished. No distress.  HENT:  Head: Normocephalic and atraumatic.  Eyes: EOM are normal.  Neck: Neck supple.  Cardiovascular: Normal rate.   Pulmonary/Chest: Effort normal. No respiratory distress.  Musculoskeletal: Normal range of motion.  Neurological: He is alert and oriented to person, place, and time.  Skin: Skin is warm and dry.  Psychiatric: He has a normal mood and affect. His behavior is normal.   BP 138/88   Pulse 78   Temp(Src) 98.5 F (36.9 C) (Oral)   Resp 17   Ht 5' 8.5" (1.74 m)   Wt 138 lb (62.596 kg)   BMI 20.68 kg/m2   SpO2 100%     Assessment & Plan:   HTN (hypertension) - Plan: hydrochlorothiazide (HYDRODIURIL) 25 MG tablet  Graves disease  Meds ordered this encounter  Medications   predniSONE (DELTASONE) 10 MG tablet    Sig: Take 10 mg by mouth daily with breakfast.   lisinopril (PRINIVIL,ZESTRIL) 10 MG tablet    Sig: Take 1 tablet (10 mg total) by mouth daily.    Dispense:  30 tablet    Refill:  5   hydrochlorothiazide (HYDRODIURIL) 25 MG  tablet    Sig: Take 1 tablet (25 mg total) by mouth daily.    Dispense:  90 tablet    Refill:  3   F/u w/ Dr Allena KatzPatel and Dr Ophelia CharterYates    I have completed the patient encounter in its entirety as documented by the scribe, with editing by me where necessary. Robert P. Merla Richesoolittle, M.D.

## 2013-12-22 DIAGNOSIS — E0500 Thyrotoxicosis with diffuse goiter without thyrotoxic crisis or storm: Secondary | ICD-10-CM | POA: Diagnosis not present

## 2014-01-11 DIAGNOSIS — E0500 Thyrotoxicosis with diffuse goiter without thyrotoxic crisis or storm: Secondary | ICD-10-CM | POA: Diagnosis not present

## 2014-02-20 DIAGNOSIS — E0500 Thyrotoxicosis with diffuse goiter without thyrotoxic crisis or storm: Secondary | ICD-10-CM | POA: Diagnosis not present

## 2014-02-27 DIAGNOSIS — E0500 Thyrotoxicosis with diffuse goiter without thyrotoxic crisis or storm: Secondary | ICD-10-CM | POA: Diagnosis not present

## 2014-04-10 DIAGNOSIS — E0500 Thyrotoxicosis with diffuse goiter without thyrotoxic crisis or storm: Secondary | ICD-10-CM | POA: Diagnosis not present

## 2014-04-16 ENCOUNTER — Other Ambulatory Visit: Payer: Self-pay | Admitting: Physician Assistant

## 2014-05-24 DIAGNOSIS — E0500 Thyrotoxicosis with diffuse goiter without thyrotoxic crisis or storm: Secondary | ICD-10-CM | POA: Diagnosis not present

## 2014-06-01 DIAGNOSIS — E0500 Thyrotoxicosis with diffuse goiter without thyrotoxic crisis or storm: Secondary | ICD-10-CM | POA: Diagnosis not present

## 2014-06-11 DIAGNOSIS — E0500 Thyrotoxicosis with diffuse goiter without thyrotoxic crisis or storm: Secondary | ICD-10-CM | POA: Diagnosis not present

## 2014-06-17 ENCOUNTER — Telehealth: Payer: Self-pay

## 2014-06-17 NOTE — Telephone Encounter (Signed)
Pt states hes taken bp and cholesterol rx for years. Says he's going to start taking geritol and wants to know if there could be any reactions.   Phone: [phone]  bf

## 2014-06-18 NOTE — Telephone Encounter (Signed)
Pt advised according to [facility]ind[facility] and the medication pt prescirbed

## 2014-06-27 ENCOUNTER — Other Ambulatory Visit: Payer: Self-pay | Admitting: Physician Assistant

## 2014-08-26 DIAGNOSIS — E0500 Thyrotoxicosis with diffuse goiter without thyrotoxic crisis or storm: Secondary | ICD-10-CM | POA: Insufficient documentation

## 2014-08-26 DIAGNOSIS — H0589 Other disorders of orbit: Secondary | ICD-10-CM | POA: Insufficient documentation

## 2014-08-27 DIAGNOSIS — E0500 Thyrotoxicosis with diffuse goiter without thyrotoxic crisis or storm: Secondary | ICD-10-CM | POA: Diagnosis not present

## 2014-08-27 DIAGNOSIS — I10 Essential (primary) hypertension: Secondary | ICD-10-CM | POA: Diagnosis not present

## 2014-09-06 DIAGNOSIS — Z23 Encounter for immunization: Secondary | ICD-10-CM | POA: Diagnosis not present

## 2014-10-01 DIAGNOSIS — E0500 Thyrotoxicosis with diffuse goiter without thyrotoxic crisis or storm: Secondary | ICD-10-CM | POA: Diagnosis not present

## 2014-10-11 DIAGNOSIS — E0500 Thyrotoxicosis with diffuse goiter without thyrotoxic crisis or storm: Secondary | ICD-10-CM | POA: Diagnosis not present

## 2014-12-11 ENCOUNTER — Ambulatory Visit (INDEPENDENT_AMBULATORY_CARE_PROVIDER_SITE_OTHER): Payer: Medicare Other | Admitting: Internal Medicine

## 2014-12-11 VITALS — BP 118/68 | HR 60 | Temp 98.0°F | Resp 17 | Ht 68.5 in | Wt 139.0 lb

## 2014-12-11 DIAGNOSIS — E0500 Thyrotoxicosis with diffuse goiter without thyrotoxic crisis or storm: Secondary | ICD-10-CM | POA: Diagnosis not present

## 2014-12-11 DIAGNOSIS — E785 Hyperlipidemia, unspecified: Secondary | ICD-10-CM | POA: Diagnosis not present

## 2014-12-11 DIAGNOSIS — I10 Essential (primary) hypertension: Secondary | ICD-10-CM | POA: Diagnosis not present

## 2014-12-11 DIAGNOSIS — N400 Enlarged prostate without lower urinary tract symptoms: Secondary | ICD-10-CM

## 2014-12-11 DIAGNOSIS — Z125 Encounter for screening for malignant neoplasm of prostate: Secondary | ICD-10-CM | POA: Diagnosis not present

## 2014-12-11 MED ORDER — LISINOPRIL-HYDROCHLOROTHIAZIDE 10-12.5 MG PO TABS: 1.0000 | ORAL_TABLET | Freq: Every day | ORAL | Status: DC

## 2014-12-11 NOTE — Progress Notes (Signed)
Subjective:  This chart was scribed for Tami Lin, MD by Molli Posey, Medical scribe. This patient was seen in ROOM 2 and the patient's care was started 6:29 PM.   Patient ID: Brian Willis, male    DOB: 1946/01/26, 69 y.o.   MRN: [id number] Chief Complaint  Patient presents with  . Medication Refill    htcz, lisinopril     HPI  HPI Comments: Andres Bantz is a 69 y.o. male with a history of HTN and thyroid disease who presents to [facility] for medication refills. His last visit was one year ago and he missed follow-up. Pt states that his blood pressure has been fluctuating recently. Pt states he was changed from lisinopril 3m to hydrodiuril 277mbut says his BP has  been elevated recently. He has had leftover lisinopril and sometimes takes both without clear reasoning as he doesn't monitor home blood pressures. Pt states that he is out of his lisinopril medication and that he needs refilled//he thinks this works better and he would like to restart it.    He states that the has never been diagnosed with DM.  Pt states he has his thyroid checked in December by endocrinology and says that he was told his thyroid was normal. He states that he is still on half a pill of methimazole medication for his thyroid.  Pt reports no modifying factors at this time. Pt reports no history of prostate problems but the chart suggests that he has BPH. He has no current symptoms  Patient Active Problem List   Diagnosis Date Noted  . Graves disease 07/26/2013  . BPH (benign prostatic hyperplasia) 07/26/2013  . HTN (hypertension)   . Hyperlipidemia    Past Medical History  Diagnosis Date  . HTN (hypertension)   . Hyperlipidemia   . Thyroid disease    Current Outpatient Prescriptions on File Prior to Visit  Medication Sig Dispense Refill  . atorvastatin (LIPITOR) 40 MG tablet Take 1 tablet (40 mg total) by mouth daily. PATIENT NEEDS OFFICE VISIT/LABS FOR ADDITIONAL REFILLS 30 tablet 0  .  hydrochlorothiazide (HYDRODIURIL) 25 MG tablet Take 1 tablet (25 mg total) by mouth daily. 90 tablet 3  . lisinopril (PRINIVIL,ZESTRIL) 10 MG tablet Take 1 tablet (10 mg total) by mouth daily. 30 tablet 5  . cetirizine (ZYRTEC) 10 MG tablet Take 1 tablet (10 mg total) by mouth daily. (Patient not taking: Reported on 12/11/2014) 30 tablet 11  . naphazoline (NAPHCON) 0.1 % ophthalmic solution Place 1 drop into both eyes 4 (four) times daily as needed. (Patient not taking: Reported on 12/11/2014) 15 mL 0  . predniSONE (DELTASONE) 10 MG tablet Take 10 mg by mouth daily with breakfast.     No current facility-administered medications on file prior to visit.   Allergies  Allergen Reactions  . Norvasc [Amlodipine Besylate] Itching   Past Surgical History  Procedure Laterality Date  . Appendectomy     No family history on file.   Review of Systems  Constitutional: Negative for fever, activity change, appetite change, fatigue and unexpected weight change.  HENT: Negative for trouble swallowing.   Eyes: Negative for visual disturbance.  Respiratory: Negative for shortness of breath.   Cardiovascular: Negative for chest pain, palpitations and leg swelling.  Gastrointestinal: Negative for abdominal pain.  Genitourinary: Negative for difficulty urinating.  Neurological: Negative for dizziness and headaches.  Psychiatric/Behavioral: Negative for sleep disturbance.      Objective:   Physical Exam  Constitutional: He is oriented to person,  place, and time. He appears well-developed and well-nourished. No distress.  HENT:  Head: Normocephalic and atraumatic.  Eyes: Conjunctivae and EOM are normal. Pupils are equal, round, and reactive to light.  Neck: Neck supple.  Cardiovascular: Normal rate and regular rhythm.   Pulmonary/Chest: Effort normal.  Neurological: He is alert and oriented to person, place, and time. No cranial nerve deficit.  Psychiatric: He has a normal mood and affect. His behavior  is normal. Thought content normal.  Nursing note and vitals reviewed.  Filed Vitals:   12/11/14 1735  BP: 118/68  Pulse: 60  Temp: 98 F (36.7 C)  TempSrc: Oral  Resp: 17  Height: 5' 8.5" (1.74 m)  Weight: 139 lb (63.05 kg)  SpO2: 100%       Assessment & Plan:  Essential hypertension - Plan: CBC with Differential/Platelet, Comprehensive metabolic panel  Hyperlipidemia - Plan: Lipid panel  Screening for prostate cancer - Plan: PSA  Graves disease  BPH (benign prostatic hyperplasia)  Labs will be drawn for routine care and meds will be refilled. Meds ordered this encounter  Medications  . methimazole (TAPAZOLE) 10 MG tablet//from his endocrinologist     Sig:     Refill:  11  . lisinopril-hydrochlorothiazide (PRINZIDE,ZESTORETIC) 10-12.5 MG per tablet    Sig: Take 1 tablet by mouth daily.    Dispense:  90 tablet    Refill:  3   he should be on both medications Lipid medication--- he ran out of more than one month ago///will refill after labs He will follow-up for Medicare annual wellness visit in one month  Addend-labs Results for orders placed or performed in visit on 12/11/14  CBC with Differential/Platelet  Result Value Ref Range   WBC 7.2 4.0 - 10.5 K/uL   RBC 5.06 4.22 - 5.81 MIL/uL   Hemoglobin 13.9 13.0 - 17.0 g/dL   HCT 40.7 39.0 - 52.0 %   MCV 80.4 78.0 - 100.0 fL   MCH 27.5 26.0 - 34.0 pg   MCHC 34.2 30.0 - 36.0 g/dL   RDW 14.2 11.5 - 15.5 %   Platelets 186 150 - 400 K/uL   MPV 10.2 8.6 - 12.4 fL   Neutrophils Relative % 45 43 - 77 %   Neutro Abs 3.2 1.7 - 7.7 K/uL   Lymphocytes Relative 43 12 - 46 %   Lymphs Abs 3.1 0.7 - 4.0 K/uL   Monocytes Relative 7 3 - 12 %   Monocytes Absolute 0.5 0.1 - 1.0 K/uL   Eosinophils Relative 5 0 - 5 %   Eosinophils Absolute 0.4 0.0 - 0.7 K/uL   Basophils Relative 0 0 - 1 %   Basophils Absolute 0.0 0.0 - 0.1 K/uL   Smear Review Criteria for review not met   Comprehensive metabolic panel  Result Value Ref Range    Sodium 138 135 - 145 mEq/L   Potassium 3.8 3.5 - 5.3 mEq/L   Chloride 99 96 - 112 mEq/L   CO2 27 19 - 32 mEq/L   Glucose, Bld 75 70 - 99 mg/dL   BUN 11 6 - 23 mg/dL   Creat 1.06 0.50 - 1.35 mg/dL   Total Bilirubin 1.0 0.2 - 1.2 mg/dL   Alkaline Phosphatase 66 39 - 117 U/L   AST 20 0 - 37 U/L   ALT 13 0 - 53 U/L   Total Protein 7.9 6.0 - 8.3 g/dL   Albumin 4.8 3.5 - 5.2 g/dL   Calcium 10.5 8.4 - 10.5  mg/dL  Lipid panel  Result Value Ref Range   Cholesterol 192 0 - 200 mg/dL   Triglycerides 98 <150 mg/dL   HDL 77 >39 mg/dL   Total CHOL/HDL Ratio 2.5 Ratio   VLDL 20 0 - 40 mg/dL   LDL Cholesterol 95 0 - 99 mg/dL  PSA  Result Value Ref Range   PSA  <=4.00 ng/mL   should remain off of cholesterol medications for now

## 2014-12-12 ENCOUNTER — Encounter: Payer: Self-pay | Admitting: Internal Medicine

## 2014-12-12 LAB — CBC WITH DIFFERENTIAL/PLATELET
BASOS ABS: 0 10*3/uL (ref 0.0–0.1)
Basophils Relative: 0 % (ref 0–1)
Eosinophils Absolute: 0.4 10*3/uL (ref 0.0–0.7)
Eosinophils Relative: 5 % (ref 0–5)
HEMATOCRIT: 40.7 % (ref 39.0–52.0)
HEMOGLOBIN: 13.9 g/dL (ref 13.0–17.0)
LYMPHS PCT: 43 % (ref 12–46)
Lymphs Abs: 3.1 10*3/uL (ref 0.7–4.0)
MCH: 27.5 pg (ref 26.0–34.0)
MCHC: 34.2 g/dL (ref 30.0–36.0)
MCV: 80.4 fL (ref 78.0–100.0)
MONO ABS: 0.5 10*3/uL (ref 0.1–1.0)
MONOS PCT: 7 % (ref 3–12)
MPV: 10.2 fL (ref 8.6–12.4)
NEUTROS ABS: 3.2 10*3/uL (ref 1.7–7.7)
Neutrophils Relative %: 45 % (ref 43–77)
Platelets: 186 10*3/uL (ref 150–400)
RBC: 5.06 MIL/uL (ref 4.22–5.81)
RDW: 14.2 % (ref 11.5–15.5)
WBC: 7.2 10*3/uL (ref 4.0–10.5)

## 2014-12-12 LAB — LIPID PANEL
CHOLESTEROL: 192 mg/dL (ref 0–200)
HDL: 77 mg/dL (ref >39)
LDL Cholesterol: 95 mg/dL (ref 0–99)
TRIGLYCERIDES: 98 mg/dL (ref <150)
Total CHOL/HDL Ratio: 2.5 Ratio
VLDL: 20 mg/dL (ref 0–40)

## 2014-12-12 LAB — COMPREHENSIVE METABOLIC PANEL
ALK PHOS: 66 U/L (ref 39–117)
ALT: 13 U/L (ref 0–53)
AST: 20 U/L (ref 0–37)
Albumin: 4.8 g/dL (ref 3.5–5.2)
BUN: 11 mg/dL (ref 6–23)
CO2: 27 meq/L (ref 19–32)
Calcium: 10.5 mg/dL (ref 8.4–10.5)
Chloride: 99 mEq/L (ref 96–112)
Creat: 1.06 mg/dL (ref 0.50–1.35)
GLUCOSE: 75 mg/dL (ref 70–99)
POTASSIUM: 3.8 meq/L (ref 3.5–5.3)
Sodium: 138 mEq/L (ref 135–145)
TOTAL PROTEIN: 7.9 g/dL (ref 6.0–8.3)
Total Bilirubin: 1 mg/dL (ref 0.2–1.2)

## 2014-12-13 LAB — PSA: PSA: 0.9 ng/mL (ref <=4.00)

## 2014-12-14 ENCOUNTER — Encounter: Payer: Self-pay | Admitting: Internal Medicine

## 2014-12-17 ENCOUNTER — Ambulatory Visit (INDEPENDENT_AMBULATORY_CARE_PROVIDER_SITE_OTHER): Payer: Medicare Other | Admitting: Family Medicine

## 2014-12-17 VITALS — BP 142/80 | HR 61 | Temp 97.4°F | Resp 16 | Ht 68.5 in | Wt 143.0 lb

## 2014-12-17 DIAGNOSIS — M545 Low back pain, unspecified: Secondary | ICD-10-CM

## 2014-12-17 DIAGNOSIS — S39011A Strain of muscle, fascia and tendon of abdomen, initial encounter: Secondary | ICD-10-CM

## 2014-12-17 DIAGNOSIS — R1011 Right upper quadrant pain: Secondary | ICD-10-CM

## 2014-12-17 DIAGNOSIS — R109 Unspecified abdominal pain: Secondary | ICD-10-CM

## 2014-12-17 LAB — POCT URINALYSIS DIPSTICK
BILIRUBIN UA: NEGATIVE
Glucose, UA: NEGATIVE
Ketones, UA: NEGATIVE
NITRITE UA: NEGATIVE
PROTEIN UA: NEGATIVE
Spec Grav, UA: 1.01
UROBILINOGEN UA: 0.2
pH, UA: 5

## 2014-12-17 LAB — POCT CBC
GRANULOCYTE PERCENT: 74.3 % (ref 37–80)
HEMATOCRIT: 42.9 % — AB (ref 43.5–53.7)
Hemoglobin: 13.9 g/dL — AB (ref 14.1–18.1)
LYMPH, POC: 2.7 (ref 0.6–3.4)
MCH, POC: 27.3 pg (ref 27–31.2)
MCHC: 32.4 g/dL (ref 31.8–35.4)
MCV: 84.2 fL (ref 80–97)
MID (cbc): 0.1 (ref 0–0.9)
MPV: 7.8 fL (ref 0–99.8)
POC Granulocyte: 8.1 — AB (ref 2–6.9)
POC LYMPH %: 24.6 % (ref 10–50)
POC MID %: 1.1 % (ref 0–12)
Platelet Count, POC: 197 10*3/uL (ref 142–424)
RBC: 5.09 M/uL (ref 4.69–6.13)
RDW, POC: 14.6 %
WBC: 10.9 10*3/uL — AB (ref 4.6–10.2)

## 2014-12-17 LAB — POCT UA - MICROSCOPIC ONLY
Bacteria, U Microscopic: NEGATIVE
CRYSTALS, UR, HPF, POC: NEGATIVE
Casts, Ur, LPF, POC: NEGATIVE
MUCUS UA: NEGATIVE
YEAST UA: NEGATIVE

## 2014-12-17 MED ORDER — ETODOLAC 400 MG PO TABS: ORAL_TABLET | ORAL | Status: DC

## 2014-12-17 MED ORDER — METHOCARBAMOL 500 MG PO TABS: ORAL_TABLET | ORAL | Status: DC

## 2014-12-17 MED ORDER — HYDROCODONE-ACETAMINOPHEN 5-325 MG PO TABS: ORAL_TABLET | ORAL | Status: DC

## 2014-12-17 NOTE — Patient Instructions (Signed)
Take the etodolac one twice daily at breakfast and supper for pain and inflammation and flank. Can discontinue when doing better  Take the methocarbamol 500 mg 1 pill 4 times daily for relaxant. Can discontinue when doing better  Take the hydrocodone if needed for severe pain  Use heat or warm compresses to your right low back several times daily for about 15 minutes as needed to give some relief  Return if worse at which case we may need to do a CT scan to make sure the kidneys and spine are okay.

## 2014-12-17 NOTE — Progress Notes (Signed)
Subjective: 69 year old man who is here a week ago and saw Dr. Merla Richesoolittle. After going home he sat in his recliner and lean back. When he got up he was having intense pain in the right flank and side area. He sounded like he had leaned against something hard. He has persisted with hurting in that area for the last 5 days. 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 He said he just tufted out. He tried some Aleve and ibuprofen without relief. Has not had this pain before. He has had an appendectomy some years ago. He says he hurts in both thigh areas also.  Objective: Complaining of a lot of pain. Abdomen has the right lower quadrant surgical scar. Normal bowel sounds. Abdomen is soft without mass or tenderness. No tenderness down toward the groin region. Examine him then standing up. He has pain in the lower lumbar spine, mostly around the L3 area but it is only mildly tender to touch. No CVA tenderness. He cannot tilt side to side due to the pain and with flexion and extension also very resisted to by him. Nontender in the thighs. Seated straight leg raising is negative.  Assessment: Right flank and low back pain, etiology undetermined  Plan: CBC and urinalysis  Results for orders placed or performed in visit on 12/17/14  POCT CBC  Result Value Ref Range   WBC 10.9 (A) 4.6 - 10.2 K/uL   Lymph, poc 2.7 0.6 - 3.4   POC LYMPH PERCENT 24.6 10 - 50 %L   MID (cbc) 0.1 0 - 0.9   POC MID % 1.1 0 - 12 %M   POC Granulocyte 8.1 (A) 2 - 6.9   Granulocyte percent 74.3 37 - 80 %G   RBC 5.09 4.69 - 6.13 M/uL   Hemoglobin 13.9 (A) 14.1 - 18.1 g/dL   HCT, POC 29.542.9 (A) 62.143.5 - 53.7 %   MCV 84.2 80 - 97 fL   MCH, POC 27.3 27 - 31.2 pg   MCHC 32.4 31.8 - 35.4 g/dL   RDW, POC 30.814.6 %   Platelet Count, POC 197 142 - 424 K/uL   MPV 7.8 0 - 99.8 fL  POCT urinalysis dipstick  Result Value Ref Range   Color, UA yellow    Clarity, UA clear    Glucose, UA neg    Bilirubin, UA neg    Ketones, UA neg    Spec Grav, UA 1.010    Blood, UA moderate    pH, UA 5.0    Protein, UA neg    Urobilinogen, UA 0.2    Nitrite, UA neg    Leukocytes, UA Trace   POCT UA - Microscopic Only  Result Value Ref Range   WBC, Ur, HPF, POC 0-2    RBC, urine, microscopic 1-4    Bacteria, U Microscopic neg    Mucus, UA neg    Epithelial cells, urine per micros 0-1    Crystals, Ur, HPF, POC neg    Casts, Ur, LPF, POC neg    Yeast, UA neg

## 2014-12-19 ENCOUNTER — Other Ambulatory Visit: Payer: Self-pay | Admitting: Family Medicine

## 2014-12-19 ENCOUNTER — Telehealth: Payer: Self-pay

## 2014-12-19 NOTE — Telephone Encounter (Signed)
Labs wnl letter sent 2/5  Check out meds w/ dr hopper re message #3

## 2014-12-19 NOTE — Telephone Encounter (Signed)
Pt called about an fax that his pharmacy sent over regarding meds he was given on 12/17/14 by Dr. Alwyn RenHopper. Pt states his insurance company rejected the coverage and he had to pay out of pocket.  He is requesting that Dr. Alwyn RenHopper fill out the paperwork and state that he needs the meds for his pain.

## 2014-12-19 NOTE — Telephone Encounter (Signed)
Pt is calling about labs from 2/2. They haven't been reviewed.

## 2014-12-19 NOTE — Telephone Encounter (Signed)
Pt calling for lab results from 2/2. Please advise. CB # [phone]

## 2014-12-20 NOTE — Telephone Encounter (Signed)
Ask pharmacy to re-send the fax.  I do not recall seeing it, and I went through papers last night.    Ask patient which of his medicines were denied?  None of the pain meds were critical, only helpful for pain relief.  If still having bad symptoms he should consider returning for recheck

## 2014-12-21 NOTE — Telephone Encounter (Signed)
Started PA, awaiting response.  

## 2014-12-21 NOTE — Telephone Encounter (Signed)
Pt called regarding PA. Please advise.

## 2014-12-21 NOTE — Telephone Encounter (Signed)
Called the pharmacy, he needs prior authorization for the Robaxin.

## 2014-12-26 NOTE — Telephone Encounter (Signed)
Left message for pt to call back. I didn't receive a response from [employer] regarding his approval.

## 2014-12-26 NOTE — Telephone Encounter (Signed)
Spoke with pt, he end up paying out of pocket for this medication, 20 dollars. I will see if [employer] responds to the PA and possibly he can be reimbursed. Pt understood.

## 2015-01-02 ENCOUNTER — Ambulatory Visit (INDEPENDENT_AMBULATORY_CARE_PROVIDER_SITE_OTHER): Payer: Medicare Other | Admitting: Internal Medicine

## 2015-01-02 ENCOUNTER — Encounter: Payer: Self-pay | Admitting: Internal Medicine

## 2015-01-02 VITALS — BP 150/88 | HR 71 | Temp 97.7°F | Resp 16 | Ht 68.5 in | Wt 141.6 lb

## 2015-01-02 DIAGNOSIS — E785 Hyperlipidemia, unspecified: Secondary | ICD-10-CM | POA: Diagnosis not present

## 2015-01-02 DIAGNOSIS — N400 Enlarged prostate without lower urinary tract symptoms: Secondary | ICD-10-CM | POA: Diagnosis not present

## 2015-01-02 DIAGNOSIS — Z0000 Encounter for general adult medical examination without abnormal findings: Secondary | ICD-10-CM

## 2015-01-02 DIAGNOSIS — Z1211 Encounter for screening for malignant neoplasm of colon: Secondary | ICD-10-CM

## 2015-01-02 DIAGNOSIS — E0500 Thyrotoxicosis with diffuse goiter without thyrotoxic crisis or storm: Secondary | ICD-10-CM

## 2015-01-02 DIAGNOSIS — I10 Essential (primary) hypertension: Secondary | ICD-10-CM | POA: Diagnosis not present

## 2015-01-02 LAB — IFOBT (OCCULT BLOOD): IMMUNOLOGICAL FECAL OCCULT BLOOD TEST: NEGATIVE

## 2015-01-02 MED ORDER — HYDROCHLOROTHIAZIDE 25 MG PO TABS: 25.0000 mg | ORAL_TABLET | Freq: Every day | ORAL | Status: DC

## 2015-01-02 MED ORDER — LISINOPRIL 10 MG PO TABS: 10.0000 mg | ORAL_TABLET | Freq: Every day | ORAL | Status: DC

## 2015-01-02 NOTE — Progress Notes (Signed)
Subjective:    Patient ID: Brian Willis, male    DOB: Feb 01, 1946, 68 y.o.   MRN: [id number]  HPI here for annual physical examination and annual Medicare wellness visit Patient Active Problem List   Diagnosis Date Noted  . Graves disease with exophthalmos --- currently controlled with Tapazole  Ophthalmology says he's doing well from a vision standpoint  He has follow-up with endocrinology  07/26/2013  . BPH (benign prostatic hyperplasia)--this is not been much of an issue since he had urinary obstruction which was cared for by urology in [place] //currently asymptomatic  07/26/2013  . HTN (hypertension)--he is concerned about today's blood pressure 150/88  Since he was started back on medicine one month ago with lisinopril 10/12.5 HCT he has had blood pressures in this range on outside readings  He has a history of responding fairly well to 25 mg suppository thiazide. Is unclear why he was changed from this.  He remains asymptomatic with regard to cardiovascular system  He was never a smoker     . Hyperlipidemia--he has been recommended for treatment in the past and has taken medicines in the past but note that his recent lipid profile off of all medications is totally normal so this problem can be eliminated     He declines colonoscopy after refers to continue fecal occult blood testing  He declines Prevnar and prefers to read about it and see if his insurance will pay for   He is up-to-date with Pneumovax and TDap zostavax 2012  Medicare annual wellness form =scanned All parameters met  Review of Systems 14 point review of systems is negative except for ear symptoms bilaterally, right greater than left. He has a rustling sound with frequent changes in his ability to hear. 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 There is no pain and no current sinus trouble.  He also has noticed some visual problems but does  know that this is related to his exophthalmos from his thyroid disease and he reports that this is getting better     Objective:   Physical Exam  Constitutional: He is oriented to person, place, and time. He appears well-developed and well-nourished.  HENT:  Head: Normocephalic and atraumatic.  Right Ear: Hearing, tympanic membrane, external ear and ear canal normal.  Left Ear: Hearing, tympanic membrane, external ear and ear canal normal.  Nose: Nose normal.  Mouth/Throat: Uvula is midline, oropharynx is clear and moist and mucous membranes are normal.  Eyes: Conjunctivae, EOM and lids are normal. Pupils are equal, round, and reactive to light. Right eye exhibits no discharge. Left eye exhibits no discharge. No scleral icterus.  Neck: Trachea normal and normal range of motion. Neck supple. Carotid bruit is not present.  Cardiovascular: Normal rate, regular rhythm, normal heart sounds, intact distal pulses and normal pulses.   No murmur heard. Pulmonary/Chest: Effort normal and breath sounds normal. No respiratory distress. He has no wheezes. He has no rhonchi. He has no rales.  Abdominal: Soft. Normal appearance and bowel sounds are normal. He exhibits no abdominal bruit. There is no tenderness.  Genitourinary:  His prostate is soft and symmetrical without nodules or tenderness Rectal exam is without masses  Musculoskeletal: Normal range of motion. He exhibits no edema or tenderness.  Lymphadenopathy:       Head (right side): No submental, no submandibular, no tonsillar, no preauricular, no posterior auricular  and no occipital adenopathy present.       Head (left side): No submental, no submandibular, no tonsillar, no preauricular, no posterior auricular and no occipital adenopathy present.    He has no cervical adenopathy.  Neurological: He is alert and oriented to person, place, and time. He has normal strength and normal reflexes. No cranial nerve deficit or sensory deficit. Coordination  and gait normal.  Skin: Skin is warm, dry and intact. No lesion and no rash noted.  Psychiatric: He has a normal mood and affect. His speech is normal and behavior is normal. Judgment and thought content normal.   BP 150/88 mmHg  Pulse 71  Temp(Src) 97.7 F (36.5 C) (Oral)  Resp 16  Ht 5' 8.5" (1.74 m)  Wt 141 lb 9.6 oz (64.229 kg)  BMI 21.21 kg/m2  SpO2 99% Results for orders placed or performed in visit on 01/02/15  IFOBT POC (occult bld, rslt in office)  Result Value Ref Range   IFOBT Negative         Assessment & Plan:  Annual Medicare wellness visit initial  Postponed Prevnar  Declines colonoscopy Annual physical exam regarding: Patient Active Problem List   Diagnosis Date Noted  . Graves disease--- improving-follow-up with endocrine and ophthalmology  07/26/2013  . BPH (benign prostatic hyperplasia)--stable with normal PSA  07/26/2013  . HTN (hypertension)--- changed to 25 mg of hydrochlorothiazide and 10 mg of lisinopril as separate tablets after using up his current supply of 10/12.5 by taking 2 daily and recording his home blood pressures      -  Hyperlipidemia resolved

## 2015-01-15 DIAGNOSIS — I10 Essential (primary) hypertension: Secondary | ICD-10-CM | POA: Diagnosis not present

## 2015-01-15 DIAGNOSIS — H532 Diplopia: Secondary | ICD-10-CM | POA: Diagnosis not present

## 2015-01-15 DIAGNOSIS — E0500 Thyrotoxicosis with diffuse goiter without thyrotoxic crisis or storm: Secondary | ICD-10-CM | POA: Diagnosis not present

## 2015-01-15 DIAGNOSIS — H269 Unspecified cataract: Secondary | ICD-10-CM | POA: Diagnosis not present

## 2015-01-15 DIAGNOSIS — H11823 Conjunctivochalasis, bilateral: Secondary | ICD-10-CM | POA: Diagnosis not present

## 2015-01-15 DIAGNOSIS — H538 Other visual disturbances: Secondary | ICD-10-CM | POA: Diagnosis not present

## 2015-03-29 DIAGNOSIS — E0500 Thyrotoxicosis with diffuse goiter without thyrotoxic crisis or storm: Secondary | ICD-10-CM | POA: Diagnosis not present

## 2015-04-08 DIAGNOSIS — E0500 Thyrotoxicosis with diffuse goiter without thyrotoxic crisis or storm: Secondary | ICD-10-CM | POA: Diagnosis not present

## 2015-06-10 DIAGNOSIS — E0500 Thyrotoxicosis with diffuse goiter without thyrotoxic crisis or storm: Secondary | ICD-10-CM | POA: Diagnosis not present

## 2015-07-16 DIAGNOSIS — H2513 Age-related nuclear cataract, bilateral: Secondary | ICD-10-CM | POA: Diagnosis not present

## 2015-07-16 DIAGNOSIS — E0500 Thyrotoxicosis with diffuse goiter without thyrotoxic crisis or storm: Secondary | ICD-10-CM | POA: Diagnosis not present

## 2015-08-09 DIAGNOSIS — E0500 Thyrotoxicosis with diffuse goiter without thyrotoxic crisis or storm: Secondary | ICD-10-CM | POA: Diagnosis not present

## 2015-08-30 ENCOUNTER — Telehealth: Payer: Self-pay | Admitting: Family Medicine

## 2015-09-05 ENCOUNTER — Telehealth: Payer: Self-pay | Admitting: Family Medicine

## 2015-09-05 NOTE — Telephone Encounter (Signed)
PATIENT RETURNED CALL AND HE CALLED MEDICARE AND HE WAS TOLD THAT THEY WILL NOT PAY FOR A SIX MONTH FOLLOW UP.  HE REFUSES TO COME IN AND WOULD BE IN February/ FOR HIS CPE.  HE STATED THAT HE HAS NEVER CAME IN BUT ONCE A YEAR AND MEDICARE TOLD HIM THERE WAS NO GAPS IN HIS CARE.  HE WAS VERY UPSET THAT I EVEN CALLED HIM TO COME IN.  I TOLD HIM THAT IT WAS FINE WE WOULD SEE HIM IN February FOR HIS CPE.

## 2015-09-05 NOTE — Telephone Encounter (Signed)
SPOKE WITH PATIENT AND HE IS GOING TO COME TO THE WALK IN CLINIC TO HAVE HTN AND BMI CHECK DOCUMENTED AND FOLLOW UP PLAN FOR BOTH IN [employer] [employer] TO CLOSE GAPS IN CARE.

## 2015-09-09 DIAGNOSIS — Z23 Encounter for immunization: Secondary | ICD-10-CM | POA: Diagnosis not present

## 2015-10-01 DIAGNOSIS — E0500 Thyrotoxicosis with diffuse goiter without thyrotoxic crisis or storm: Secondary | ICD-10-CM | POA: Diagnosis not present

## 2015-11-29 DIAGNOSIS — E0500 Thyrotoxicosis with diffuse goiter without thyrotoxic crisis or storm: Secondary | ICD-10-CM | POA: Diagnosis not present

## 2016-01-21 DIAGNOSIS — H02053 Trichiasis without entropian right eye, unspecified eyelid: Secondary | ICD-10-CM | POA: Diagnosis not present

## 2016-01-21 DIAGNOSIS — E0500 Thyrotoxicosis with diffuse goiter without thyrotoxic crisis or storm: Secondary | ICD-10-CM | POA: Diagnosis not present

## 2016-01-21 DIAGNOSIS — Z79891 Long term (current) use of opiate analgesic: Secondary | ICD-10-CM | POA: Diagnosis not present

## 2016-01-21 DIAGNOSIS — I10 Essential (primary) hypertension: Secondary | ICD-10-CM | POA: Diagnosis not present

## 2016-01-21 DIAGNOSIS — H02821 Cysts of right upper eyelid: Secondary | ICD-10-CM | POA: Diagnosis not present

## 2016-01-21 DIAGNOSIS — H11823 Conjunctivochalasis, bilateral: Secondary | ICD-10-CM | POA: Diagnosis not present

## 2016-01-21 DIAGNOSIS — H269 Unspecified cataract: Secondary | ICD-10-CM | POA: Diagnosis not present

## 2016-01-21 DIAGNOSIS — Z79899 Other long term (current) drug therapy: Secondary | ICD-10-CM | POA: Diagnosis not present

## 2016-01-29 ENCOUNTER — Other Ambulatory Visit: Payer: Self-pay | Admitting: Internal Medicine

## 2016-01-31 ENCOUNTER — Telehealth: Payer: Self-pay

## 2016-01-31 MED ORDER — HYDROCHLOROTHIAZIDE 25 MG PO TABS: 25.0000 mg | ORAL_TABLET | Freq: Every day | ORAL | Status: DC

## 2016-01-31 NOTE — Telephone Encounter (Signed)
Pt is checking on the status of his refill request on his blood pressure meds  Best number [phone]

## 2016-01-31 NOTE — Telephone Encounter (Signed)
Rx sent in

## 2016-03-17 ENCOUNTER — Ambulatory Visit (INDEPENDENT_AMBULATORY_CARE_PROVIDER_SITE_OTHER): Payer: Medicare Other | Admitting: Emergency Medicine

## 2016-03-17 ENCOUNTER — Ambulatory Visit (INDEPENDENT_AMBULATORY_CARE_PROVIDER_SITE_OTHER): Payer: Medicare Other

## 2016-03-17 VITALS — BP 138/84 | HR 68 | Temp 98.2°F | Resp 18 | Ht 68.5 in | Wt 142.0 lb

## 2016-03-17 DIAGNOSIS — M5010 Cervical disc disorder with radiculopathy, unspecified cervical region: Secondary | ICD-10-CM | POA: Diagnosis not present

## 2016-03-17 DIAGNOSIS — R1011 Right upper quadrant pain: Secondary | ICD-10-CM

## 2016-03-17 DIAGNOSIS — R109 Unspecified abdominal pain: Secondary | ICD-10-CM

## 2016-03-17 DIAGNOSIS — M545 Low back pain, unspecified: Secondary | ICD-10-CM

## 2016-03-17 DIAGNOSIS — M542 Cervicalgia: Secondary | ICD-10-CM

## 2016-03-17 DIAGNOSIS — M5032 Other cervical disc degeneration, mid-cervical region, unspecified level: Secondary | ICD-10-CM | POA: Diagnosis not present

## 2016-03-17 MED ORDER — HYDROCODONE-ACETAMINOPHEN 5-325 MG PO TABS: ORAL_TABLET | ORAL | Status: DC

## 2016-03-17 MED ORDER — PREDNISONE 10 MG PO TABS
ORAL_TABLET | ORAL | Status: DC
Start: 2016-03-17 — End: 2016-09-22

## 2016-03-17 NOTE — Patient Instructions (Addendum)
   IF you received an x-ray today, you will receive an invoice from [facility]. Please contact [facility] at [phone] with questions or concerns regarding your invoice.   IF you received labwork today, you will receive an invoice from [facility]. Please contact [facility] at [phone] with questions or concerns regarding your invoice.   Our billing staff will not be able to assist you with questions regarding bills from these companies.  You will be contacted with the lab results as soon as they are available. The fastest way to get your results is to activate your My Chart account. Instructions are located on the last page of this paperwork. If you have not heard from us regarding the results in 2 weeks, please contact this office.     Cervical Radiculopathy Cervical radiculopathy happens when a nerve in the neck (cervical nerve) is pinched or bruised. This condition can develop because of an injury or as part of the normal aging process. Pressure on the cervical nerves can cause pain or numbness that runs from the neck all the way down into the arm and fingers. Usually, this condition gets better with rest. Treatment may be needed if the condition does not improve.  CAUSES This condition may be caused by:  Injury.  Slipped (herniated) disk.  Muscle tightness in the neck because of overuse.  Arthritis.  Breakdown or degeneration in the bones and joints of the spine (spondylosis) due to aging.  Bone spurs that may develop near the cervical nerves. SYMPTOMS Symptoms of this condition include:  Pain that runs from the neck to the arm and hand. The pain can be severe or irritating. It may be worse when the neck is moved.  Numbness or weakness in the affected arm and hand. DIAGNOSIS This condition may be diagnosed based on symptoms, medical history, and a physical exam. You may also have tests, including:  X-rays.  CT  scan.  MRI.  Electromyogram (EMG).  Nerve conduction tests. TREATMENT In many cases, treatment is not needed for this condition. With rest, the condition usually gets better over time. If treatment is needed, options may include:  Wearing a soft neck collar for short periods of time.  Physical therapy to strengthen your neck muscles.  Medicines, such as NSAIDs, oral corticosteroids, or spinal injections.  Surgery. This may be needed if other treatments do not help. Various types of surgery may be done depending on the cause of your problems. HOME CARE INSTRUCTIONS Managing Pain  Take over-the-counter and prescription medicines only as told by your health care provider.  If directed, apply ice to the affected area.  Put ice in a plastic bag.  Place a towel between your skin and the bag.  Leave the ice on for 20 minutes, 2-3 times per day.  If ice does not help, you can try using heat. Take a warm shower or warm bath, or use a heat pack as told by your health care provider.  Try a gentle neck and shoulder massage to help relieve symptoms. Activity  Rest as needed. Follow instructions from your health care provider about any restrictions on activities.  Do stretching and strengthening exercises as told by your health care provider or physical therapist. General Instructions  If you were given a soft collar, wear it as told by your health care provider.  Use a flat pillow when you sleep.  Keep all follow-up visits as told by your health care provider. This is important. SEEK   MEDICAL CARE IF:  Your condition does not improve with treatment. SEEK IMMEDIATE MEDICAL CARE IF:  Your pain gets much worse and cannot be controlled with medicines.  You have weakness or numbness in your hand, arm, face, or leg.  You have a high fever.  You have a stiff, rigid neck.  You lose control of your bowels or your bladder (have incontinence).  You have trouble with walking,  balance, or speaking.   This information is not intended to replace advice given to you by your health care provider. Make sure you discuss any questions you have with your health care provider.   Document Released: 07/21/2001 Document Revised: 07/17/2015 Document Reviewed: 12/20/2014 Elsevier Interactive Patient Education 2016 Elsevier Inc.  

## 2016-03-17 NOTE — Progress Notes (Addendum)
By signing my name below, I, [name], attest that this documentation has been prepared under the direction and in the presence of Lesle ChrisSteven Cella Cappello, MD.  Electronically Signed: Arvilla MarketMesha Guinyard, Medical Scribe. 03/17/2016. 8:15 AM.  Chief Complaint:  Chief Complaint  Patient presents with  . Neck Pain    HPI: Ephriam Knucklesndrew Patti is a 70 y.o. male who [facility] complaining of sudden severe neck pain that began 4 days ago. Pt says the pain was "terrible" 3 days ago, he thought he broke his neck. Pt says he can't move his neck without sudden pain in his upper thoracic area that radiates to the cervical section of his neck. Pt has no hx of neck pain, and denies recent trauma to the region. Pt doesn't know if he was sleeping wrong at night. Pt mentions doing janitorial work.  Past Medical History  Diagnosis Date  . HTN (hypertension)   . Hyperlipidemia   . Thyroid disease    Past Surgical History  Procedure Laterality Date  . Appendectomy     Social History   Social History  . Marital Status: Single    Spouse Name: N/A  . Number of Children: N/A  . Years of Education: N/A   Social History Main Topics  . Smoking status: Never Smoker   . Smokeless tobacco: None  . Alcohol Use: No  . Drug Use: No  . Sexual Activity: Not Asked   Other Topics Concern  . None   Social History Narrative   History reviewed. No pertinent family history. Allergies  Allergen Reactions  . Norvasc [Amlodipine Besylate] Itching   Prior to Admission medications   Medication Sig Start Date End Date Taking? Authorizing Provider  atorvastatin (LIPITOR) 40 MG tablet Take 1 tablet (40 mg total) by mouth daily. PATIENT NEEDS OFFICE VISIT/LABS FOR ADDITIONAL REFILLS 04/16/14   Tonye Pearsonobert P Doolittle, MD  cetirizine (ZYRTEC) 10 MG tablet Take 1 tablet (10 mg total) by mouth daily. Patient not taking: Reported on 12/11/2014 01/08/13   Collene GobbleSteven A Kevionna Heffler, MD  etodolac (LODINE) 400 MG tablet Take one pill twice daily  if needed for pain and inflammation 12/17/14   Peyton Najjaravid H Hopper, MD  hydrochlorothiazide (HYDRODIURIL) 25 MG tablet Take 1 tablet (25 mg total) by mouth daily. 01/31/16   Tonye Pearsonobert P Doolittle, MD  HYDROcodone-acetaminophen [facility](NORCO) 5-325 MG per tablet Take one every 6 hours only if needed for severe pain Patient not taking: Reported on 01/02/2015 12/17/14   Peyton Najjaravid H Hopper, MD  lisinopril (PRINIVIL,ZESTRIL) 10 MG tablet Take 1 tablet (10 mg total) by mouth daily. 01/02/15   Tonye Pearsonobert P Doolittle, MD  lisinopril-hydrochlorothiazide (PRINZIDE,ZESTORETIC) 10-12.5 MG per tablet Take 1 tablet by mouth daily. 12/11/14   Tonye Pearsonobert P Doolittle, MD  methimazole (TAPAZOLE) 10 MG tablet  12/10/14   Historical Provider, MD  methocarbamol (ROBAXIN) 500 MG tablet Take 1 pill 4 times daily for muscle relaxant Patient not taking: Reported on 03/17/2016 12/17/14   Peyton Najjaravid H Hopper, MD     ROS: The patient denies fevers, chills, night sweats, unintentional weight loss, chest pain, palpitations, wheezing, dyspnea on exertion, nausea, vomiting, abdominal pain, dysuria, hematuria, melena, numbness, weakness, or tingling.  All other systems have been reviewed and were otherwise negative with the exception of those mentioned in the HPI and as above.    PHYSICAL EXAM: Filed Vitals:   03/17/16 0813  BP: 138/84  Pulse: 68  Temp: 98.2 F (36.8 C)  Resp: 18   Body mass index  is 21.27 kg/(m^2).   General: Alert, no acute distress HEENT:  Normocephalic, atraumatic, oropharynx patent. Eye: Nonie Hoyer [facility] Cardiovascular:  Regular rate and rhythm, no rubs murmurs or gallops.  No Carotid bruits, radial pulse intact. No pedal edema.  Respiratory: Clear to auscultation bilaterally.  No wheezes, rales, or rhonchi.  No cyanosis, no use of accessory musculature Abdominal: No organomegaly, abdomen is soft and non-tender, positive bowel sounds.  No masses. Musculoskeletal: Gait intact. No edema, tenderness. Cooperate complaining of pain in his right  cervical area. He is unable to turn his head to the right and he has a limited ability to turn his head to the left. Skin: No rashes. Neurologic: Facial musculature symmetric. Reflexes in UE is 3+ and he has no weakness. Psychiatric: Patient acts appropriately throughout our interaction. Lymphatic: No cervical or submandibular lymphadenopathy  LABS:  EKG/XRAY:   Primary read interpreted by [facility] Cleta Alberts [facility]  Dg Cervical Spine Complete  03/17/2016  CLINICAL DATA:  Neck pain for 4-5 days with no known injury. EXAM: CERVICAL SPINE - COMPLETE 4+ VIEW COMPARISON:  None. FINDINGS: No acute findings such as fracture, endplate erosion, or prevertebral thickening. No evidence of focal bone lesion. There is degenerative disc narrowing at C3-4 and C4-5, greater at C4-5 where there is also posterior endplate ridging and right uncovertebral spurring with foraminal stenosis. Mild left-sided C5-6 uncovertebral spur without notable foraminal narrowing. 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 Electronically Signed   By: Marnee Spring M.D.   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 He does have C4-C5 degenerative disc disease. His symptoms are of a radiculopathy involving the shoulders especially on the right. He has very limited range of motion of his neck. Will put him on a prednisone taper medications for pain recheck 1 week. If symptoms are persistent he will need an MRI of his neck.I personally performed the services described in this documentation, which was scribed in my presence. The recorded information has been reviewed and is accurate.   Gross sideeffects, risk and benefits, and alternatives of medications d/w patient. Patient is aware that all medications have potential sideeffects and we are unable to predict  every sideeffect or drug-drug interaction that may occur.  Lesle Chris MD 03/17/2016 8:15 AM

## 2016-03-30 ENCOUNTER — Telehealth: Payer: Self-pay

## 2016-03-30 NOTE — Telephone Encounter (Signed)
Pt is needing to talk with someone about revising his work note with restrictions   Best number [phone]

## 2016-03-31 NOTE — Telephone Encounter (Signed)
He needs a note for restricted to light duty activity at work. He is trying to be careful but he is off of the pain pills. He states he is doing better. He is not doing worse. He does Restaurant manager, fast foodjanitorial services. He uses the vacuum cleaner that goes on his back and neck. Please advise.

## 2016-04-01 NOTE — Telephone Encounter (Signed)
PT was seen 5/9 and in Dr Deforest Hoylesaubs note if his pain was not improved he needed an MRI.  Before he can have a further work note esp with restrictiosn he will need to be seen.

## 2016-04-02 ENCOUNTER — Ambulatory Visit (INDEPENDENT_AMBULATORY_CARE_PROVIDER_SITE_OTHER): Payer: Medicare Other | Admitting: Family Medicine

## 2016-04-02 VITALS — BP 130/80 | HR 66 | Temp 98.2°F | Resp 18 | Ht 68.5 in | Wt 141.0 lb

## 2016-04-02 DIAGNOSIS — M5010 Cervical disc disorder with radiculopathy, unspecified cervical region: Secondary | ICD-10-CM | POA: Diagnosis not present

## 2016-04-02 NOTE — Patient Instructions (Addendum)
  I have placed you on work restrictions for the next 6 weeks. Please come back in for follow up if you are doing worse or if you need an extension- you must have a visit to extend your work restrictions.    IF you received an x-ray today, you will receive an invoice from [facility]. Please contact [facility] at [phone] with questions or concerns regarding your invoice.   IF you received labwork today, you will receive an invoice from [facility]. Please contact [facility] at [phone] with questions or concerns regarding your invoice.   Our billing staff will not be able to assist you with questions regarding bills from these companies.  You will be contacted with the lab results as soon as they are available. The fastest way to get your results is to activate your My Chart account. Instructions are located on the last page of this paperwork. If you have not heard from us regarding the results in 2 weeks, please contact this office.

## 2016-04-02 NOTE — Progress Notes (Signed)
   Subjective:    Patient ID: Brian Willis, male    DOB: 05/29/1946, 70 y.o.   MRN: [id number]  HPI This is a 70 yo male who presents today for follow up of neck pain. He was seen 03/17/16 by Dr. Cleta Albertsaub and given prednisone taper.He reports that the pain and swelling are much better. 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 No numbness or tingling down arms, no weakness. No pain at night. He has changed to a flat pillow and has some stiffness in the morning. He finished his prednisone and has not taken a pain pill for several weeks, has about 5 left from the 15 he was prescribed. He requests to be continued on light duty.   Past Medical History  Diagnosis Date  . HTN (hypertension)   . Hyperlipidemia   . Thyroid disease    Past Surgical History  Procedure Laterality Date  . Appendectomy     History reviewed. No pertinent family history. Social History  Substance Use Topics  . Smoking status: Never Smoker   . Smokeless tobacco: None  . Alcohol Use: No      Review of Systems Per HPI    Objective:   Physical Exam  Constitutional: He is oriented to person, place, and time. He appears well-developed and well-nourished.  HENT:  Head: Normocephalic and atraumatic.  Eyes: Conjunctivae are normal.  Neck: Normal range of motion. Neck supple. No spinous process tenderness and no muscular tenderness present. No edema, no erythema and normal range of motion present.  Cardiovascular: Normal rate, regular rhythm and normal heart sounds.   Pulmonary/Chest: Effort normal and breath sounds normal.  Musculoskeletal: Normal range of motion.  Neurological: He is alert and oriented to person, place, and time.  Skin: Skin is warm and dry.  Psychiatric: He has a normal mood and affect. His behavior is normal. Judgment and thought content normal.  Vitals reviewed.     BP 130/80 mmHg  Pulse 66  Temp(Src) 98.2 F (36.8 C) (Oral)  Resp 18  Ht 5' 8.5"  (1.74 m)  Wt 141 lb (63.957 kg)  BMI 21.12 kg/m2  SpO2 99%     Assessment & Plan:  1. Cervical disc disorder with radiculopathy of cervical region - he has improved considerably  - discussed need for MRI if pain returns per Dr. Cleta Albertsaub - given light duty restrictions for 6 weeks and instructed him he would have to be seen to have this extended - RTC precautions reviewed   Olean Reeeborah Gessner, FNP-BC  [facility]  04/04/2016 4:45 PM

## 2016-04-02 NOTE — Telephone Encounter (Signed)
Advised pt to RTC to see a provider for a revision of his note.

## 2016-05-22 DIAGNOSIS — E0500 Thyrotoxicosis with diffuse goiter without thyrotoxic crisis or storm: Secondary | ICD-10-CM | POA: Diagnosis not present

## 2016-05-28 DIAGNOSIS — E0500 Thyrotoxicosis with diffuse goiter without thyrotoxic crisis or storm: Secondary | ICD-10-CM | POA: Diagnosis not present

## 2016-09-22 ENCOUNTER — Ambulatory Visit (INDEPENDENT_AMBULATORY_CARE_PROVIDER_SITE_OTHER): Payer: Medicare Other | Admitting: Family Medicine

## 2016-09-22 VITALS — BP 136/92 | HR 64 | Temp 98.5°F | Resp 17 | Ht 69.5 in | Wt 141.0 lb

## 2016-09-22 DIAGNOSIS — R3129 Other microscopic hematuria: Secondary | ICD-10-CM

## 2016-09-22 DIAGNOSIS — I10 Essential (primary) hypertension: Secondary | ICD-10-CM | POA: Diagnosis not present

## 2016-09-22 DIAGNOSIS — R351 Nocturia: Secondary | ICD-10-CM

## 2016-09-22 DIAGNOSIS — Z13220 Encounter for screening for lipoid disorders: Secondary | ICD-10-CM | POA: Diagnosis not present

## 2016-09-22 DIAGNOSIS — R350 Frequency of micturition: Secondary | ICD-10-CM

## 2016-09-22 LAB — COMPLETE METABOLIC PANEL WITH GFR
ALBUMIN: 4.8 g/dL (ref 3.6–5.1)
ALK PHOS: 48 U/L (ref 40–115)
ALT: 12 U/L (ref 9–46)
AST: 18 U/L (ref 10–35)
BUN: 7 mg/dL (ref 7–25)
CALCIUM: 10.4 mg/dL — AB (ref 8.6–10.3)
CHLORIDE: 102 mmol/L (ref 98–110)
CO2: 28 mmol/L (ref 20–31)
Creat: 1 mg/dL (ref 0.70–1.18)
GFR, EST AFRICAN AMERICAN: 88 mL/min (ref >=60)
GFR, EST NON AFRICAN AMERICAN: 76 mL/min (ref >=60)
Glucose, Bld: 87 mg/dL (ref 65–99)
POTASSIUM: 3.7 mmol/L (ref 3.5–5.3)
Sodium: 138 mmol/L (ref 135–146)
Total Bilirubin: 1.1 mg/dL (ref 0.2–1.2)
Total Protein: 7.7 g/dL (ref 6.1–8.1)

## 2016-09-22 LAB — POC MICROSCOPIC URINALYSIS (UMFC): MUCUS RE: ABSENT

## 2016-09-22 LAB — POCT URINALYSIS DIP (MANUAL ENTRY)
Bilirubin, UA: NEGATIVE
GLUCOSE UA: NEGATIVE
Ketones, POC UA: NEGATIVE
LEUKOCYTES UA: NEGATIVE
Nitrite, UA: NEGATIVE
PROTEIN UA: NEGATIVE
UROBILINOGEN UA: 0.2
pH, UA: 5.5

## 2016-09-22 LAB — PSA: PSA: 0.6 ng/mL (ref <=4.0)

## 2016-09-22 MED ORDER — LISINOPRIL 10 MG PO TABS: 10.0000 mg | ORAL_TABLET | Freq: Every day | ORAL | 1 refills | Status: DC

## 2016-09-22 MED ORDER — HYDROCHLOROTHIAZIDE 12.5 MG PO TABS: 12.5000 mg | ORAL_TABLET | Freq: Every day | ORAL | 1 refills | Status: DC

## 2016-09-22 NOTE — Progress Notes (Signed)
By signing my name below, I, Brian Willis, attest that this documentation has been prepared under the direction and in the presence of Brian Staggers, MD.  Electronically Signed: [name], Medical Scribe. 09/22/16. 11:54 AM.  Subjective:    Patient ID: Brian Willis, male    DOB: July 19, 1946, 70 y.o.   MRN: [id number]  HPI Chief Complaint  Patient presents with  . Medication Refill    hctz,lisinopril     HPI Comments: Brian Willis is a 70 y.o. male with a PMHx of HTN, and Grave's disease who [facility] the [facility] for HTN follow-up. He is followed by [facility]. Pt is fasting.  HTN: Pt has been out of HCTZ for a month and has been taking lisinopril. Pt's bp reading is 130-140/82 when he checks it. Pt has been exercising. Denies chest pain, abdominal pain, and difficulty breathing. Lab Results  Component Value Date   CREATININE 1.06 12/11/2014   Lab Results  Component Value Date   CHOL 192 12/11/2014   HDL 77 12/11/2014   LDLCALC 95 12/11/2014   TRIG 98 12/11/2014   CHOLHDL 2.5 12/11/2014   BP Readings from Last 3 Encounters:  09/22/16 122/72  04/02/16 130/80  03/17/16 138/84   Bladder: Reports experiencing dysuria for a few months as well as urinary urgency, and urinary frequency. He drinks a lot of water and has had nocturia 3-4x a night.  He has complianed of urinary frequency in the past, including a visit with me 06/07/13. We did discuss urology eval at that time, but he deferred. He had a few RBC on the urinalysis Feb 8,2016 last year. Patient Active Problem List   Diagnosis Date Noted  . Cervical disc disorder with radiculopathy of cervical region 03/17/2016  . 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 Graves disease 07/26/2013  . BPH (benign prostatic hyperplasia) 07/26/2013  . HTN (hypertension)    Past Medical History:  Diagnosis Date  . HTN (hypertension)   . Hyperlipidemia   . Thyroid disease    Past  Surgical History:  Procedure Laterality Date  . APPENDECTOMY     Allergies  Allergen Reactions  . Norvasc [Amlodipine Besylate] Itching   Prior to Admission medications   Medication Sig Start Date End Date Taking? Authorizing Provider  hydrochlorothiazide (HYDRODIURIL) 25 MG tablet Take 1 tablet (25 mg total) by mouth daily. 01/31/16  Yes Tonye Pearson, MD  lisinopril (PRINIVIL,ZESTRIL) 10 MG tablet Take 1 tablet (10 mg total) by mouth daily. 01/02/15  Yes Tonye Pearson, MD  etodolac (LODINE) 400 MG tablet Take one pill twice daily if needed for pain and inflammation Patient not taking: Reported on 09/22/2016 12/17/14   Peyton Najjar, MD   Social History   Social History  . Marital status: Single    Spouse name: N/A  . Number of children: N/A  . Years of education: N/A   Occupational History  . Not on file.   Social History Main Topics  . Smoking status: Never Smoker  . Smokeless tobacco: Not on file  . Alcohol use No  . Drug use: No  . Sexual activity: Not on file   Other Topics Concern  . Not on file   Social History Narrative  . No narrative on file   Review of Systems  Cardiovascular: Negative for chest pain.  Gastrointestinal: Negative for abdominal pain.  Genitourinary: Positive for dysuria, frequency and urgency.    Objective:  Physical Exam  Constitutional: He appears  well-developed and well-nourished. No distress.  HENT:  Head: Normocephalic and atraumatic.  Eyes: Conjunctivae are normal.  Neck: Neck supple.  Cardiovascular: Normal rate and normal heart sounds.  Exam reveals no gallop and no friction rub.   No murmur heard. Pulmonary/Chest: Effort normal. No respiratory distress. He has no wheezes. He has no rales.  Abdominal: Soft. He exhibits no distension. There is no tenderness. There is no CVA tenderness.  Musculoskeletal: He exhibits no edema.  Neurological: He is alert.  Skin: Skin is warm and dry.  Psychiatric: He has a normal mood and  affect. His behavior is normal.  Nursing note and vitals reviewed.  BP 122/72 (BP Location: Right Arm, Patient Position: Sitting, Cuff Size: Normal)   Pulse 64   Temp 98.5 F (36.9 C) (Oral)   Resp 17   Ht 5' 9.5" (1.765 m)   Wt 141 lb (64 kg)   SpO2 100%   BMI 20.52 kg/m    Results for orders placed or performed in visit on 09/22/16  POCT urinalysis dipstick  Result Value Ref Range   Color, UA yellow yellow   Clarity, UA clear clear   Glucose, UA negative negative   Bilirubin, UA negative negative   Ketones, POC UA negative negative   Spec Grav, UA <=1.005    Blood, UA small (A) negative   pH, UA 5.5    Protein Ur, POC negative negative   Urobilinogen, UA 0.2    Nitrite, UA Negative Negative   Leukocytes, UA Negative Negative  POCT Microscopic Urinalysis ([facility])  Result Value Ref Range   WBC,UR,HPF,POC None None WBC/hpf   RBC,UR,HPF,POC None None RBC/hpf   Bacteria None None, Too numerous to count   Mucus Absent Absent   Epithelial Cells, UR Per Microscopy None None, Too numerous to count cells/hpf   Assessment & Plan:   Brian Willis is a 70 y.o. male Essential hypertension - Plan: COMPLETE METABOLIC PANEL WITH GFR, hydrochlorothiazide (HYDRODIURIL) 12.5 MG tablet, lisinopril (PRINIVIL,ZESTRIL) 10 MG tablet  - Overall stable only on lisinopril. Few borderline elevated readings. Check outside readings and if remaining over 140/90, restart HCTZ but at lower dose of 12.5 mg daily. Labs pending.  Screening for hyperlipidemia - Plan: COMPLETE METABOLIC PANEL WITH GFR  Nocturia - Plan: POCT urinalysis dipstick, POCT Microscopic Urinalysis ([facility]), PSA, Ambulatory referral to Urology Urinary frequency - Plan: POCT urinalysis dipstick, POCT Microscopic Urinalysis ([facility]), PSA, Ambulatory referral to Urology Other microscopic hematuria  - Repeat PSA, no apparent concerning findings for infection on exam today. Some component of nocturia due to increased by mouth fluids in the  evening.  -decrease by mouth fluids within a few hours of bedtime.  -History of hematuria, microscopic, referral to urology as discussed previously to determine further workup or imaging if needed.  Meds ordered this encounter  Medications  . hydrochlorothiazide (HYDRODIURIL) 12.5 MG tablet    Sig: Take 1 tablet (12.5 mg total) by mouth daily.    Dispense:  90 tablet    Refill:  1  . lisinopril (PRINIVIL,ZESTRIL) 10 MG tablet    Sig: Take 1 tablet (10 mg total) by mouth daily.    Dispense:  90 tablet    Refill:  1   Patient Instructions   For blood pressure, your level is borderline controlled in the office, but if you check pressure outside of the office and readings are staying over 140/90, hydrochlorothiazide 12.5mg  once per day. If you experience any lightheadedness or dizziness with that new medicine, that  may be too much and come back and see me. Continue lisinopril at 10 mg once per day and recheck in the next 3-6 months to make sure this is the correct medication regimen for you.   For your urinary symptoms, avoid large amounts of water prior to going to bed, but as your symptoms have been present for a while, I would recommend you meet with a urologist. You did have some possible blood in the urine in the past as well as today, but under the microscope we did not see any blood. Either way I would like you to meet with a urologist, and will refer you.  I will also recheck a prostate test today.   IF you received an x-ray today, you will receive an invoice from [facility]. Please contact [facility] at [phone] with questions or concerns regarding your invoice.   IF you received labwork today, you will receive an invoice from [facility]. Please contact [facility] at [phone] with questions or concerns regarding your invoice.   Our billing staff will not be able to assist you with questions regarding bills from these companies.  You  will be contacted with the lab results as soon as they are available. The fastest way to get your results is to activate your My Chart account. Instructions are located on the last page of this paperwork. If you have not heard from us regarding the results in 2 weeks, please contact this office.        I personally performed the services described in this documentation, which was scribed in my presence. The recorded information has been reviewed and considered, and addended by me as needed.   Signed,   Brian StaggersJeffrey Tamarion Haymond, MD [facility].  09/23/16 8:20 AM

## 2016-09-22 NOTE — Patient Instructions (Addendum)
For blood pressure, your level is borderline controlled in the office, but if you check pressure outside of the office and readings are staying over 140/90, hydrochlorothiazide 12.5mg  once per day. If you experience any lightheadedness or dizziness with that new medicine, that may be too much and come back and see me. Continue lisinopril at 10 mg once per day and recheck in the next 3-6 months to make sure this is the correct medication regimen for you.   For your urinary symptoms, avoid large amounts of water prior to going to bed, but as your symptoms have been present for a while, I would recommend you meet with a urologist. You did have some possible blood in the urine in the past as well as today, but under the microscope we did not see any blood. Either way I would like you to meet with a urologist, and will refer you.  I will also recheck a prostate test today.   IF you received an x-ray today, you will receive an invoice from [facility]. Please contact [facility] at [phone] with questions or concerns regarding your invoice.   IF you received labwork today, you will receive an invoice from [facility]. Please contact [facility] at [phone] with questions or concerns regarding your invoice.   Our billing staff will not be able to assist you with questions regarding bills from these companies.  You will be contacted with the lab results as soon as they are available. The fastest way to get your results is to activate your My Chart account. Instructions are located on the last page of this paperwork. If you have not heard from us regarding the results in 2 weeks, please contact this office.

## 2016-11-24 ENCOUNTER — Ambulatory Visit (INDEPENDENT_AMBULATORY_CARE_PROVIDER_SITE_OTHER): Payer: Medicare Other | Admitting: Family Medicine

## 2016-11-24 VITALS — BP 168/84 | HR 74 | Temp 97.7°F | Resp 17 | Ht 69.5 in | Wt 140.0 lb

## 2016-11-24 DIAGNOSIS — I10 Essential (primary) hypertension: Secondary | ICD-10-CM

## 2016-11-24 MED ORDER — HYDROCHLOROTHIAZIDE 25 MG PO TABS: 25.0000 mg | ORAL_TABLET | Freq: Every day | ORAL | 1 refills | Status: DC

## 2016-11-24 NOTE — Patient Instructions (Addendum)
IF you received an x-ray today, you will receive an invoice from [facility]. Please contact [facility] at [phone] with questions or concerns regarding your invoice.   IF you received labwork today, you will receive an invoice from [facility]. Please contact [facility] at [phone] with questions or concerns regarding your invoice.   Our billing staff will not be able to assist you with questions regarding bills from these companies.  You will be contacted with the lab results as soon as they are available. The fastest way to get your results is to activate your My Chart account. Instructions are located on the last page of this paperwork. If you have not heard from [place] regarding the results in 2 weeks, please contact this office.     I think you would do well establishing with a Primary Care Practioner who you know and trust so you just have 1 point of contact for your cholesterol and blood pressure now that Dr. Merla Riches and Dr. Alwyn Ren have retired.  Since you live in [place] [facility] [place], I would highly recommend the   [facility]. [address] [place], [place] [address]  Phone: [phone] Dr. Willow Ora Mr. Esperanza Richters Dr. Arva Chafe  Amongst many other doctors there are fantastic.   DASH Eating Plan DASH stands for "Dietary Approaches to Stop Hypertension." The DASH eating plan is a healthy eating plan that has been shown to reduce high blood pressure (hypertension). Additional health benefits may include reducing the risk of type 2 diabetes mellitus, heart disease, and stroke. The DASH eating plan may also help with weight loss. What do I need to know about the DASH eating plan? For the DASH eating plan, you will follow these general guidelines:  Choose foods with less than 150 milligrams of sodium per serving (as listed on the food label).  Use salt-free seasonings or herbs instead of table salt or sea  salt.  Check with your health care provider or pharmacist before using salt substitutes.  Eat lower-sodium products. These are often labeled as "low-sodium" or "no salt added."  Eat fresh foods. Avoid eating a lot of canned foods.  Eat more vegetables, fruits, and low-fat dairy products.  Choose whole grains. Look for the word "whole" as the first word in the ingredient list.  Choose fish and skinless chicken or [place] more often than red meat. Limit fish, poultry, and meat to 6 oz (170 g) each day.  Limit sweets, desserts, sugars, and sugary drinks.  Choose heart-healthy fats.  Eat more home-cooked food and less restaurant, buffet, and fast food.  Limit fried foods.  Do not fry foods. Cook foods using methods such as baking, boiling, grilling, and broiling instead.  When eating at a restaurant, ask that your food be prepared with less salt, or no salt if possible. What foods can I eat? Seek help from a dietitian for individual calorie needs. Grains  Whole grain or whole wheat bread. Brown rice. Whole grain or whole wheat pasta. Quinoa, bulgur, and whole grain cereals. Low-sodium cereals. Corn or whole wheat flour tortillas. Whole grain cornbread. Whole grain crackers. Low-sodium crackers. Vegetables  Fresh or frozen vegetables (raw, steamed, roasted, or grilled). Low-sodium or reduced-sodium tomato and vegetable juices. Low-sodium or reduced-sodium tomato sauce and paste. Low-sodium or reduced-sodium canned vegetables. Fruits  All fresh, canned (in natural juice), or frozen fruits. Meat and Other Protein Products  Ground beef (85% or leaner), grass-fed  beef, or beef trimmed of fat. Skinless chicken or [place]. Ground chicken or [place]. Pork trimmed of fat. All fish and seafood. Eggs. Dried beans, peas, or lentils. Unsalted nuts and seeds. Unsalted canned beans. Dairy  Low-fat dairy products, such as skim or 1% milk, 2% or reduced-fat cheeses, low-fat ricotta or cottage cheese, or  plain low-fat yogurt. Low-sodium or reduced-sodium cheeses. Fats and Oils  Tub margarines without trans fats. Light or reduced-fat mayonnaise and salad dressings (reduced sodium). Avocado. Safflower, olive, or canola oils. Natural peanut or almond butter. Other  Unsalted popcorn and pretzels. The items listed above may not be a complete list of recommended foods or beverages. Contact your dietitian for more options.  What foods are not recommended? Grains  White bread. White pasta. White rice. Refined cornbread. Bagels and croissants. Crackers that contain trans fat. Vegetables  Creamed or fried vegetables. Vegetables in a cheese sauce. Regular canned vegetables. Regular canned tomato sauce and paste. Regular tomato and vegetable juices. Fruits  Canned fruit in light or heavy syrup. Fruit juice. Meat and Other Protein Products  Fatty cuts of meat. Ribs, chicken wings, bacon, sausage, bologna, salami, chitterlings, fatback, hot dogs, bratwurst, and packaged luncheon meats. Salted nuts and seeds. Canned beans with salt. Dairy  Whole or 2% milk, cream, half-and-half, and cream cheese. Whole-fat or sweetened yogurt. Full-fat cheeses or blue cheese. Nondairy creamers and whipped toppings. Processed cheese, cheese spreads, or cheese curds. Condiments  Onion and garlic salt, seasoned salt, table salt, and sea salt. Canned and packaged gravies. Worcestershire sauce. Tartar sauce. Barbecue sauce. Teriyaki sauce. Soy sauce, including reduced sodium. Steak sauce. Fish sauce. Oyster sauce. Cocktail sauce. Horseradish. Ketchup and mustard. Meat flavorings and tenderizers. Bouillon cubes. Hot sauce. Tabasco sauce. Marinades. Taco seasonings. Relishes. Fats and Oils  Butter, stick margarine, lard, shortening, ghee, and bacon fat. Coconut, palm kernel, or palm oils. Regular salad dressings. Other  Pickles and olives. Salted popcorn and pretzels. The items listed above may not be a complete list of foods and  beverages to avoid. Contact your dietitian for more information.  Where can I find more information? National Heart, Lung, and Blood Institute: CablePromo.itwww.nhlbi.nih.gov/health/health-topics/topics/dash/ This information is not intended to replace advice given to you by your health care provider. Make sure you discuss any questions you have with your health care provider. Document Released: 10/15/2011 Document Revised: 04/02/2016 Document Reviewed: 08/30/2013 Elsevier Interactive Patient Education  2017 [employer]

## 2016-11-24 NOTE — Progress Notes (Signed)
Subjective:  By signing my name below, I, Essence Howell, attest that this documentation has been prepared under the direction and in the presence of Norberto Sorenson, MD Electronically Signed: Charline Bills, ED Scribe 11/24/2016 at 12:37 PM.   Patient ID: Brian Willis, male    DOB: [birth year], 71 y.o.   MRN: [id number]  Chief Complaint  Patient presents with  . Hypertension   HPI Brian Willis is a 71 y.o. male, with a h/o HTN, who presents to Primary Care at [facility] complaining of elevated BP. Pt saw Dr. Neva Seat 2 months ago for elevated BP. Pt is followed by [facility]. Last visit, pt was out of HCTZ for 1 month and complaining of urinary symptoms. He was advised to continue on Lisinopril 10 and instructed to restart if his systolic was over 140. Pt was referred to urology for microscopic hematuria and urinary frequency. PSA and CMP were normal.   Today, pt states that he last checked his BP outside of the office a few days ago with a reading of 174/90 at home. Triage BP: 168/84. He has noticed intermittent HAs. Pt denies dizziness, visual disturbances, chest tightness, chest pain, back pain, leg swelling, light-headedness, loss of appetite.   Past Medical History:  Diagnosis Date  . HTN (hypertension)   . Hyperlipidemia   . Thyroid disease    Current Outpatient Prescriptions on File Prior to Visit  Medication Sig Dispense Refill  . hydrochlorothiazide (HYDRODIURIL) 12.5 MG tablet Take 1 tablet (12.5 mg total) by mouth daily. 90 tablet 1  . lisinopril (PRINIVIL,ZESTRIL) 10 MG tablet Take 1 tablet (10 mg total) by mouth daily. 90 tablet 1   No current facility-administered medications on file prior to visit.    Allergies  Allergen Reactions  . Norvasc [Amlodipine Besylate] Itching   Review of Systems  Constitutional: Negative for appetite change.  Eyes: Negative for visual disturbance.  Respiratory: Negative for chest tightness.   Cardiovascular: Negative for chest pain and  leg swelling.  Musculoskeletal: Negative for back pain.  Neurological: Positive for headaches (intermittent). Negative for dizziness and light-headedness.      Objective:   Physical Exam  Constitutional: He is oriented to person, place, and time. He appears well-developed and well-nourished. No distress.  HENT:  Head: Normocephalic and atraumatic.  Eyes: Conjunctivae and EOM are normal.  Neck: Neck supple. Carotid bruit is not present. No tracheal deviation present. No thyromegaly present.  Cardiovascular: Normal rate, regular rhythm, S1 normal, S2 normal and normal heart sounds.   Pulses:      Carotid pulses are 2+ on the right side, and 2+ on the left side. Pulmonary/Chest: Effort normal and breath sounds normal. No respiratory distress.  Musculoskeletal: Normal range of motion.  Lymphadenopathy:    He has no cervical adenopathy.  Neurological: He is alert and oriented to person, place, and time.  Skin: Skin is warm and dry.  Psychiatric: He has a normal mood and affect. His behavior is normal.  Nursing note and vitals reviewed.  BP (!) 168/84 (BP Location: Right Arm, Patient Position: Sitting, Cuff Size: Normal)   Pulse 74   Temp 97.7 F (36.5 C) (Oral)   Resp 17   Ht 5' 9.5" (1.765 m)   Wt 140 lb (63.5 kg)   SpO2 100%   BMI 20.38 kg/m     Assessment & Plan:   1. Essential hypertension, benign   2. Essential hypertension   Rec establishing with PCP to follow chronic med conditions of HTN and  HLD.  Meds ordered this encounter  Medications  . hydrochlorothiazide (HYDRODIURIL) 25 MG tablet    Sig: Take 1 tablet (25 mg total) by mouth daily.    Dispense:  90 tablet    Refill:  1    I personally performed the services described in this documentation, which was scribed in my presence. The recorded information has been reviewed and considered, and addended by me as needed.   Norberto SorensonEva Camisha Srey, M.D.  [facility]   [address] [place]  [id number] [phone] phone [phone] fax  12/08/16 9:21 PM

## 2016-12-09 ENCOUNTER — Emergency Department (HOSPITAL_BASED_OUTPATIENT_CLINIC_OR_DEPARTMENT_OTHER): Payer: Medicare Other

## 2016-12-09 ENCOUNTER — Inpatient Hospital Stay (HOSPITAL_BASED_OUTPATIENT_CLINIC_OR_DEPARTMENT_OTHER)
Admission: EM | Admit: 2016-12-09 | Discharge: 2016-12-11 | DRG: 195 | Disposition: A | Discharge status: Home / Self Care | Payer: Medicare Other | Attending: Nephrology | Admitting: Nephrology

## 2016-12-09 ENCOUNTER — Encounter (HOSPITAL_BASED_OUTPATIENT_CLINIC_OR_DEPARTMENT_OTHER): Payer: Self-pay

## 2016-12-09 DIAGNOSIS — R55 Syncope and collapse: Secondary | ICD-10-CM | POA: Diagnosis not present

## 2016-12-09 DIAGNOSIS — J101 Influenza due to other identified influenza virus with other respiratory manifestations: Secondary | ICD-10-CM | POA: Diagnosis not present

## 2016-12-09 DIAGNOSIS — E860 Dehydration: Secondary | ICD-10-CM | POA: Diagnosis present

## 2016-12-09 DIAGNOSIS — E0500 Thyrotoxicosis with diffuse goiter without thyrotoxic crisis or storm: Secondary | ICD-10-CM | POA: Diagnosis not present

## 2016-12-09 DIAGNOSIS — B349 Viral infection, unspecified: Secondary | ICD-10-CM

## 2016-12-09 DIAGNOSIS — R05 Cough: Secondary | ICD-10-CM | POA: Diagnosis not present

## 2016-12-09 DIAGNOSIS — E869 Volume depletion, unspecified: Secondary | ICD-10-CM | POA: Diagnosis not present

## 2016-12-09 DIAGNOSIS — Z79899 Other long term (current) drug therapy: Secondary | ICD-10-CM

## 2016-12-09 DIAGNOSIS — E785 Hyperlipidemia, unspecified: Secondary | ICD-10-CM | POA: Diagnosis not present

## 2016-12-09 DIAGNOSIS — I10 Essential (primary) hypertension: Secondary | ICD-10-CM | POA: Diagnosis not present

## 2016-12-09 DIAGNOSIS — J209 Acute bronchitis, unspecified: Secondary | ICD-10-CM | POA: Diagnosis not present

## 2016-12-09 DIAGNOSIS — Z8249 Family history of ischemic heart disease and other diseases of the circulatory system: Secondary | ICD-10-CM

## 2016-12-09 DIAGNOSIS — R69 Illness, unspecified: Secondary | ICD-10-CM | POA: Diagnosis not present

## 2016-12-09 DIAGNOSIS — J111 Influenza due to unidentified influenza virus with other respiratory manifestations: Secondary | ICD-10-CM | POA: Diagnosis present

## 2016-12-09 LAB — URINALYSIS, MICROSCOPIC (REFLEX)

## 2016-12-09 LAB — COMPREHENSIVE METABOLIC PANEL
ALBUMIN: 4.5 g/dL (ref 3.5–5.0)
ALT: 41 U/L (ref 17–63)
AST: 62 U/L — AB (ref 15–41)
Alkaline Phosphatase: 59 U/L (ref 38–126)
Anion gap: 7 (ref 5–15)
BUN: 15 mg/dL (ref 6–20)
CHLORIDE: 100 mmol/L — AB (ref 101–111)
CO2: 28 mmol/L (ref 22–32)
Calcium: 9.5 mg/dL (ref 8.9–10.3)
Creatinine, Ser: 1.25 mg/dL — ABNORMAL HIGH (ref 0.61–1.24)
GFR calc Af Amer: >60 mL/min (ref >60)
GFR, EST NON AFRICAN AMERICAN: 57 mL/min — AB (ref >60)
Glucose, Bld: 116 mg/dL — ABNORMAL HIGH (ref 65–99)
POTASSIUM: 3.9 mmol/L (ref 3.5–5.1)
Sodium: 135 mmol/L (ref 135–145)
Total Bilirubin: 0.6 mg/dL (ref 0.3–1.2)
Total Protein: 7.8 g/dL (ref 6.5–8.1)

## 2016-12-09 LAB — URINALYSIS, ROUTINE W REFLEX MICROSCOPIC
Bilirubin Urine: NEGATIVE
Glucose, UA: NEGATIVE mg/dL
Ketones, ur: NEGATIVE mg/dL
LEUKOCYTES UA: NEGATIVE
Nitrite: NEGATIVE
Protein, ur: NEGATIVE mg/dL
Specific Gravity, Urine: 1.021 (ref 1.005–1.030)
pH: 5.5 (ref 5.0–8.0)

## 2016-12-09 LAB — TSH: TSH: 0.387 u[IU]/mL (ref 0.350–4.500)

## 2016-12-09 LAB — I-STAT CG4 LACTIC ACID, ED
LACTIC ACID, VENOUS: 2.06 mmol/L — AB (ref 0.5–1.9)
Lactic Acid, Venous: 1.98 mmol/L (ref 0.5–1.9)

## 2016-12-09 LAB — CBC
HCT: 42.3 % (ref 39.0–52.0)
Hemoglobin: 14.2 g/dL (ref 13.0–17.0)
MCH: 27.3 pg (ref 26.0–34.0)
MCHC: 33.6 g/dL (ref 30.0–36.0)
MCV: 81.3 fL (ref 78.0–100.0)
PLATELETS: 123 10*3/uL — AB (ref 150–400)
RBC: 5.2 MIL/uL (ref 4.22–5.81)
RDW: 14.4 % (ref 11.5–15.5)
WBC: 7.4 10*3/uL (ref 4.0–10.5)

## 2016-12-09 LAB — TROPONIN I

## 2016-12-09 MED ORDER — ACETAMINOPHEN 650 MG RE SUPP
650.0000 mg | Freq: Four times a day (QID) | RECTAL | Status: DC | PRN
Start: 2016-12-09 — End: 2016-12-11

## 2016-12-09 MED ORDER — ACETAMINOPHEN 325 MG PO TABS: 650.0000 mg | ORAL_TABLET | Freq: Four times a day (QID) | ORAL | Status: DC | PRN

## 2016-12-09 MED ORDER — ONDANSETRON HCL 4 MG/2ML IJ SOLN: 4.0000 mg | Freq: Four times a day (QID) | INTRAMUSCULAR | Status: DC | PRN

## 2016-12-09 MED ORDER — ONDANSETRON HCL 4 MG PO TABS: 4.0000 mg | ORAL_TABLET | Freq: Four times a day (QID) | ORAL | Status: DC | PRN

## 2016-12-09 MED ORDER — SODIUM CHLORIDE 0.9% FLUSH
3.0000 mL | Freq: Two times a day (BID) | INTRAVENOUS | Status: DC
  Administered 2016-12-09 – 2016-12-10 (×2): 3 mL via INTRAVENOUS

## 2016-12-09 MED ORDER — SODIUM CHLORIDE 0.9 % IV SOLN
INTRAVENOUS | Status: DC
  Administered 2016-12-09: 23:00:00 via INTRAVENOUS

## 2016-12-09 MED ORDER — BENZONATATE 100 MG PO CAPS
200.0000 mg | ORAL_CAPSULE | Freq: Once | ORAL | Status: AC
  Administered 2016-12-09: 200 mg via ORAL
  Filled 2016-12-09: qty 2

## 2016-12-09 MED ORDER — SODIUM CHLORIDE 0.9 % IV BOLUS (SEPSIS)
1000.0000 mL | Freq: Once | INTRAVENOUS | Status: AC
  Administered 2016-12-09: 1000 mL via INTRAVENOUS

## 2016-12-09 MED ORDER — ACETAMINOPHEN 500 MG PO TABS
500.0000 mg | ORAL_TABLET | Freq: Once | ORAL | Status: AC
  Administered 2016-12-09: 500 mg via ORAL
  Filled 2016-12-09: qty 1

## 2016-12-09 MED ORDER — GUAIFENESIN-DM 100-10 MG/5ML PO SYRP
5.0000 mL | ORAL_SOLUTION | ORAL | Status: DC | PRN
  Administered 2016-12-10 – 2016-12-11 (×2): 5 mL via ORAL
  Filled 2016-12-09 (×2): qty 5

## 2016-12-09 MED ORDER — OSELTAMIVIR PHOSPHATE 75 MG PO CAPS
75.0000 mg | ORAL_CAPSULE | Freq: Two times a day (BID) | ORAL | Status: DC
Start: 2016-12-09 — End: 2016-12-11
  Administered 2016-12-09 – 2016-12-11 (×4): 75 mg via ORAL
  Filled 2016-12-09 (×4): qty 1

## 2016-12-09 MED ORDER — ENOXAPARIN SODIUM 40 MG/0.4ML ~~LOC~~ SOLN
40.0000 mg | SUBCUTANEOUS | Status: DC
  Administered 2016-12-09 – 2016-12-10 (×2): 40 mg via SUBCUTANEOUS
  Filled 2016-12-09 (×2): qty 0.4

## 2016-12-09 NOTE — ED Provider Notes (Addendum)
[facility] Provider Note   CSN: [id number] Arrival date & time: 12/09/16  1126     History   Chief Complaint Chief Complaint  Patient presents with  . Loss of Consciousness    HPI Zakary Kimura is a 71 y.o. male.  Patient indicates 2 days ago onset fever, prod cough, scratchy throat, congestion.  Has taken mucinex.  Last night, states passed out in bathroom. Denies any associated chest discomfort. No palpitations. States hit right thigh on tub then. Was able to get self up, ambulatory since. This AM went to restaurant for breakfast, and after returning home felt dizzy, lightheaded, nauseated, and passed out again. Denies injury, states fell on carpet. Again, no chest discomfort. No palpitations. Denies hx heart disease or dysrhythmia. Other than recent uri symptoms, pt denies other recent illness. Denies hx syncope. Pt denies any recent blood loss, rectal bleeding or melena. No gu c/o. Is eating and drinking.    The history is provided by the patient and the spouse.  Loss of Consciousness   Associated symptoms include congestion and fever. Pertinent negatives include abdominal pain, back pain, chest pain, confusion, headaches, palpitations, vomiting and weakness.    Past Medical History:  Diagnosis Date  . HTN (hypertension)   . Hyperlipidemia   . Thyroid disease     Patient Active Problem List   Diagnosis Date Noted  . Cervical disc disorder with radiculopathy of cervical region 03/17/2016  . 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 Graves disease 07/26/2013  . BPH (benign prostatic hyperplasia) 07/26/2013  . HTN (hypertension)     Past Surgical History:  Procedure Laterality Date  . APPENDECTOMY         Home Medications    Prior to Admission medications   Medication Sig Start Date End Date Taking? Authorizing Provider  hydrochlorothiazide (HYDRODIURIL) 25 MG tablet Take 1 tablet (25 mg total) by mouth daily. 11/24/16   Sherren Mocha, MD    lisinopril (PRINIVIL,ZESTRIL) 10 MG tablet Take 1 tablet (10 mg total) by mouth daily. 09/22/16   Shade Flood, MD    Family History No family history on file.  Social History Social History  Substance Use Topics  . Smoking status: Never Smoker  . Smokeless tobacco: Never Used  . Alcohol use No     Allergies   Patient has no active allergies.   Review of Systems Review of Systems  Constitutional: Positive for fever.  HENT: Positive for congestion and sore throat.   Eyes: Negative for redness.  Respiratory: Positive for cough. Negative for shortness of breath.   Cardiovascular: Positive for syncope. Negative for chest pain, palpitations and leg swelling.  Gastrointestinal: Negative for abdominal pain, blood in stool, diarrhea and vomiting.  Genitourinary: Negative for dysuria and flank pain.  Musculoskeletal: Negative for back pain and neck pain.  Skin: Negative for rash and wound.  Neurological: Negative for weakness, numbness and headaches.  Hematological: Does not bruise/bleed easily.  Psychiatric/Behavioral: Negative for confusion.     Physical Exam Updated Vital Signs BP 150/83 (BP Location: Right Arm)   Pulse 95   Temp 100.2 F (37.9 C) (Oral)   Resp 18   SpO2 98%   Physical Exam  Constitutional: He is oriented to person, place, and time. He appears well-developed and well-nourished. No distress.  HENT:  Head: Atraumatic.  Mouth/Throat: Oropharynx is clear and moist.  Eyes: Conjunctivae are normal. Pupils are equal, round, and reactive to light.  Neck: Normal range of motion. Neck supple.  No tracheal deviation present.  No bruits.   Cardiovascular: Normal rate, regular rhythm, normal heart sounds and intact distal pulses.  Exam reveals no gallop and no friction rub.   No murmur heard. Pulmonary/Chest: Effort normal and breath sounds normal. No accessory muscle usage. No respiratory distress. He exhibits no tenderness.  Abdominal: Soft. Bowel sounds  are normal. He exhibits no distension. There is no tenderness.  No pulsatile mass.   Genitourinary:  Genitourinary Comments: No cva tenderness  Musculoskeletal: He exhibits no edema or tenderness.  CTLS spine, non tender, aligned, no step off. Good rom bil ext without pain or focal bony tenderness. Distal pulses palp bil.   Neurological: He is alert and oriented to person, place, and time.  Speech clear, fluent. Ambulates w steady gait. Motor intact bil, sens grossly intact.   Skin: Skin is warm and dry. No rash noted. He is not diaphoretic.  Psychiatric: He has a normal mood and affect.  Nursing note and vitals reviewed.    ED Treatments / Results  Labs (all labs ordered are listed, but only abnormal results are displayed) Results for orders placed or performed during the hospital encounter of 12/09/16  CBC  Result Value Ref Range   WBC 7.4 4.0 - 10.5 K/uL   RBC 5.20 4.22 - 5.81 MIL/uL   Hemoglobin 14.2 13.0 - 17.0 g/dL   HCT 78.2 95.6 - 21.3 %   MCV 81.3 78.0 - 100.0 fL   MCH 27.3 26.0 - 34.0 pg   MCHC 33.6 30.0 - 36.0 g/dL   RDW 08.6 57.8 - 46.9 %   Platelets 123 (L) 150 - 400 K/uL  Comprehensive metabolic panel  Result Value Ref Range   Sodium 135 135 - 145 mmol/L   Potassium 3.9 3.5 - 5.1 mmol/L   Chloride 100 (L) 101 - 111 mmol/L   CO2 28 22 - 32 mmol/L   Glucose, Bld 116 (H) 65 - 99 mg/dL   BUN 15 6 - 20 mg/dL   Creatinine, Ser 6.29 (H) 0.61 - 1.24 mg/dL   Calcium 9.5 8.9 - 52.8 mg/dL   Total Protein 7.8 6.5 - 8.1 g/dL   Albumin 4.5 3.5 - 5.0 g/dL   AST 62 (H) 15 - 41 U/L   ALT 41 17 - 63 U/L   Alkaline Phosphatase 59 38 - 126 U/L   Total Bilirubin 0.6 0.3 - 1.2 mg/dL   GFR calc non Af Amer 57 (L) >60 mL/min   GFR calc Af Amer >60 >60 mL/min   Anion gap 7 5 - 15  Urinalysis, Routine w reflex microscopic  Result Value Ref Range   Color, Urine YELLOW YELLOW   APPearance CLEAR CLEAR   Specific Gravity, Urine 1.021 1.005 - 1.030   pH 5.5 5.0 - 8.0   Glucose,  UA NEGATIVE NEGATIVE mg/dL   Hgb urine dipstick SMALL (A) NEGATIVE   Bilirubin Urine NEGATIVE NEGATIVE   Ketones, ur NEGATIVE NEGATIVE mg/dL   Protein, ur NEGATIVE NEGATIVE mg/dL   Nitrite NEGATIVE NEGATIVE   Leukocytes, UA NEGATIVE NEGATIVE  Troponin I  Result Value Ref Range   Troponin I <0.03 <0.03 ng/mL  Urinalysis, Microscopic (reflex)  Result Value Ref Range   RBC / HPF 6-30 0 - 5 RBC/hpf   WBC, UA 0-5 0 - 5 WBC/hpf   Bacteria, UA RARE (A) NONE SEEN   Squamous Epithelial / LPF 0-5 (A) NONE SEEN   Mucous PRESENT   I-Stat CG4 Lactic Acid, ED  Result Value  Ref Range   Lactic Acid, Venous 1.98 (HH) 0.5 - 1.9 mmol/L   Dg Chest 2 View  Result Date: 12/09/2016 CLINICAL DATA:  Cough EXAM: CHEST  2 VIEW COMPARISON:  None. FINDINGS: Mild hyperinflation. Heart and mediastinal contours are within normal limits. No focal opacities or effusions. No acute bony abnormality. IMPRESSION: Hyperinflation.  No active cardiopulmonary disease. Electronically Signed   By: Charlett NoseKevin  Dover M.D.   On: 12/09/2016 12:14     EKG  EKG Interpretation  Date/Time:  Wednesday December 09 2016 11:48:21 EST Ventricular Rate:  85 PR Interval:    QRS Duration: 89 QT Interval:  348 QTC Calculation: 414 R Axis:   68 Text Interpretation:  Sinus rhythm Nonspecific ST abnormality No previous tracing Confirmed by Denton LankSTEINL  MD, Caryn BeeKEVIN ([id number]) on 12/09/2016 11:51:21 AM       Radiology No results found.  Procedures Procedures (including critical care time)  Medications Ordered in ED Medications  sodium chloride 0.9 % bolus 1,000 mL (not administered)     Initial Impression / Assessment and Plan / ED Course  I have reviewed the triage vital signs and the nursing notes.  Pertinent labs & imaging results that were available during my care of the patient were reviewed by me and considered in my medical decision making (see chart for details).  Monitor. Ecg. Labs. Cxr.  Iv ns bolus.   As syncopal x 2,  without clear etiology, will admit for monitoring.  Given mildly elevated creatinine compared to baseline, would hold hctz, iv ns boluses. Pt indicates would like to go to [facility].   [facility].  Discussed with Dr Evelena PeatMerrel, he [facility]  Final Clinical Impressions(s) / ED Diagnoses   Final diagnoses:  None    New Prescriptions New Prescriptions   No medications on file             Cathren LaineKevin Devun Anna, MD 12/09/16 1542

## 2016-12-09 NOTE — ED Triage Notes (Signed)
Pt states he has been treating a cold with OTC meds-pt states he got dizzy and passed out last night and again today with incontinence-pt NAD-steady gait

## 2016-12-09 NOTE — ED Notes (Signed)
Pt is alert and oriented and in no distress at this time.

## 2016-12-09 NOTE — ED Notes (Signed)
Patient transported to X-ray 

## 2016-12-09 NOTE — ED Notes (Signed)
Pt received apple juice. Tolerating well.

## 2016-12-09 NOTE — H&P (Signed)
History and Physical  Patient Name: Brian Willis     WUJ:[id number]    DOB: 08/12/1946    DOA: 12/09/2016 PCP: No PCP Per Patient   Patient coming from: Home --> [facility]  Chief Complaint: Syncope, influenza like illness  HPI: Brian Willis is a 71 y.o. male with a past medical history significant for HTN who presents with syncope and URI/fever.  The patient is health until Monday when he developed URI symptoms of cough, congestion, scratchy throat. Yesterday he started to have some fever, and at one point felt suddenly nauseous, ran to the bathroom, where he was dizzy and then passed out briefly, striking his leg on the tub on the way down. He was immediately fine afterwards, went to bed, and this morning went out to get breakfast and was back home eating a sandwich and coffee from [employer] while standing in his kitchen when he again felt suddently dizzy, sweaty and then passed out on the floor.  No seizure-like activity, tongue-biting.  He did stool on himself, and after cleaning himsefl up he came to the ER at [facility].  ED course: -Temp 101.42F, heartrate 95, respirations 18, BP 150/83, pulse ox normal -Na 135, K 3.9, Cr 1.25 (baseline 1.1), WBC 7.4K, Hgb 14.2 -Troponin negative -Lactic acid normal -UA clear -CXR without focal opacity -ECG showed normal sinus rhythm, with nonspecific TW flattening in sporadic leads, no previous for comparison -Orthostatics normal -He was given 2L NS and benzonatate and TRH were asked to evaluate for syncope    ROS: Review of Systems  Constitutional: Positive for chills, diaphoresis, fever and malaise/fatigue.  HENT: Positive for congestion and sore throat.   Respiratory: Positive for cough. Negative for sputum production and shortness of breath.   Cardiovascular: Negative for chest pain, palpitations and leg swelling.  Musculoskeletal: Negative for myalgias.  Neurological: Positive for dizziness, loss of consciousness and headaches. Negative for tingling,  tremors, sensory change, speech change, focal weakness and seizures.  All other systems reviewed and are negative.         Past Medical History:  Diagnosis Date  . HTN (hypertension)   . Hyperlipidemia   . Thyroid disease     Past Surgical History:  Procedure Laterality Date  . APPENDECTOMY      Social History: Patient lives with his wife.  The patient walks unassisted. He is from [place], lives in [place], orked as truck Hospital doctor, never smoked.    No Active Allergies  Family history: family history includes Hypertension in his father and mother.  Prior to Admission medications   Medication Sig Start Date End Date Taking? Authorizing Provider  hydrochlorothiazide (HYDRODIURIL) 25 MG tablet Take 1 tablet (25 mg total) by mouth daily. 11/24/16   Sherren Mocha, MD  lisinopril (PRINIVIL,ZESTRIL) 10 MG tablet Take 1 tablet (10 mg total) by mouth daily. 09/22/16   Shade Flood, MD       Physical Exam: BP 127/75 (BP Location: Right Arm)   Pulse 79   Temp (!) 100.7 F (38.2 C) (Oral) Comment: RN notified  Resp 18   Ht 5\' 9"  (1.753 m)   Wt 63.6 kg (140 lb 3.2 oz)   SpO2 97%   BMI 20.70 kg/m  General appearance: Well-developed, adult male, alert and in no acute distress.   Eyes: Anicteric, conjunctiva pink, lids and lashes normal. PERRL.    ENT: No nasal deformity, discharge, epistaxis.  Hearing normal. OP moist without lesions.   Neck: No neck masses.  Trachea midline.  No thyromegaly/tenderness. Lymph: No cervical or supraclavicular lymphadenopathy. Skin: Warm and dry.  No jaundice.  No suspicious rashes or lesions. Cardiac: RRR, nl S1-S2, no murmurs appreciated.  Capillary refill is brisk.  JVP normale.  No LE edema.  Radial and DP pulses 2+ and symmetric. Respiratory: Normal respiratory rate and rhythm.  CTAB without rales or wheezes. Abdomen: Abdomen soft.  No TT. No ascites, distension, hepatosplenomegaly.   MSK: No deformities or effusions.  No cyanosis or  clubbing. Neuro: Cranial nerves normal.  Sensation intact to light touch. Speech is fluent.  Muscle strength normal.    Psych: Sensorium intact and responding to questions, attention normal.  Behavior appropriate.  Affect normal.  Judgment and insight appear normal.     Labs on Admission:  I have personally reviewed following labs and imaging studies: CBC:  Recent Labs Lab 12/09/16 1222  WBC 7.4  HGB 14.2  HCT 42.3  MCV 81.3  PLT 123*   Basic Metabolic Panel:  Recent Labs Lab 12/09/16 1222  NA 135  K 3.9  CL 100*  CO2 28  GLUCOSE 116*  BUN 15  CREATININE 1.25*  CALCIUM 9.5   GFR: Estimated Creatinine Clearance (by C-G formula based on SCr of 1.25 mg/dL (H)) Male: 42 mL/min Male: 49.5 mL/min  Liver Function Tests:  Recent Labs Lab 12/09/16 1222  AST 62*  ALT 41  ALKPHOS 59  BILITOT 0.6  PROT 7.8  ALBUMIN 4.5   No results for input(s): LIPASE, AMYLASE in the last 168 hours. No results for input(s): AMMONIA in the last 168 hours. Coagulation Profile: No results for input(s): INR, PROTIME in the last 168 hours. Cardiac Enzymes:  Recent Labs Lab 12/09/16 1222  TROPONINI <0.03   BNP (last 3 results) No results for input(s): PROBNP in the last 8760 hours. HbA1C: No results for input(s): HGBA1C in the last 72 hours. CBG: No results for input(s): GLUCAP in the last 168 hours. Lipid Profile: No results for input(s): CHOL, HDL, LDLCALC, TRIG, CHOLHDL, LDLDIRECT in the last 72 hours. Thyroid Function Tests: No results for input(s): TSH, T4TOTAL, FREET4, T3FREE, THYROIDAB in the last 72 hours. Anemia Panel: No results for input(s): VITAMINB12, FOLATE, FERRITIN, TIBC, IRON, RETICCTPCT in the last 72 hours. Sepsis Labs: Lactic acid 1.98 Invalid input(s): PROCALCITONIN, LACTICIDVEN No results found for this or any previous visit (from the past 240 hour(s)).       Radiological Exams on Admission: Personally reviewed: Dg Chest 2 View  Result Date:  12/09/2016 CLINICAL DATA:  Cough EXAM: CHEST  2 VIEW COMPARISON:  None. FINDINGS: Mild hyperinflation. Heart and mediastinal contours are within normal limits. No focal opacities or effusions. No acute bony abnormality. IMPRESSION: Hyperinflation.  No active cardiopulmonary disease. Electronically Signed   By: Charlett NoseKevin  Dover M.D.   On: 12/09/2016 12:14    EKG: Independently reviewed. Rate 85, QTc normal, TW flattening, no ST changes.    Assessment/Plan  1. Influenza like illness and syncope:  He presents with URI symptoms, fever, malaise, vomiting once and an episode that is vagal in character during high influenza activity.  He meets SIRS criteria, likely from this viral syndrome, does NOT clinically have sepsis syndrome based on the information available to me.  Suspect influenza causing vagal episode, and PE or arrhythmia doubted.  He has no history of CV disease. -Flu swab and respiratory virus panel ordered -Empiric tamiflu -IV fluids -Check TSH (hx of Graves)  -IF influenza PCR/RVP negative, will start empiric abx for possible sepsis pneumonia,  repeat CXR tomorrow, and also obtain echocardiogram to eval syncope  -Repeat lactic acid      2. Hypertension:  Hypertensive on arrival -Hold lisinopril and HCTZ until hemodynamics clearer           Code Status: FULL  Family Communication: None present  Disposition Plan: Anticipate treatement of flu empirically, monitor Consults called: None At the point of initial evaluation, it is my clinical opinion stable for discharge from the hospital within 24 to 48 hours.    Medical decision making: Patient seen at 10:00 PM on 12/09/2016.  .  What exists of the patient's chart was reviewed in depth and summarized above.  Clinical condition: normal.        Alberteen Sam [facility] Pager [phone]

## 2016-12-09 NOTE — Progress Notes (Signed)
   [facility] for eval and treatment of multiple syncopal episodes of unknown etiology. Workup fairly unremarkable to this point.  Question early viral syndrome in the setting of dehydration. No evidence to suggest arrhythmia or seizure type activity.    Shelly Flattenavid Merrell, MD [facility] 12/09/2016, 3:43 PM

## 2016-12-09 NOTE — ED Notes (Signed)
ED Provider at bedside. 

## 2016-12-09 NOTE — ED Notes (Signed)
Pt offered snack. 

## 2016-12-10 DIAGNOSIS — E860 Dehydration: Secondary | ICD-10-CM | POA: Diagnosis present

## 2016-12-10 DIAGNOSIS — R55 Syncope and collapse: Secondary | ICD-10-CM

## 2016-12-10 DIAGNOSIS — Z8249 Family history of ischemic heart disease and other diseases of the circulatory system: Secondary | ICD-10-CM | POA: Diagnosis not present

## 2016-12-10 DIAGNOSIS — E785 Hyperlipidemia, unspecified: Secondary | ICD-10-CM | POA: Diagnosis present

## 2016-12-10 DIAGNOSIS — E0500 Thyrotoxicosis with diffuse goiter without thyrotoxic crisis or storm: Secondary | ICD-10-CM | POA: Diagnosis present

## 2016-12-10 DIAGNOSIS — R69 Illness, unspecified: Secondary | ICD-10-CM | POA: Diagnosis not present

## 2016-12-10 DIAGNOSIS — J209 Acute bronchitis, unspecified: Secondary | ICD-10-CM | POA: Diagnosis present

## 2016-12-10 DIAGNOSIS — I10 Essential (primary) hypertension: Secondary | ICD-10-CM

## 2016-12-10 DIAGNOSIS — J101 Influenza due to other identified influenza virus with other respiratory manifestations: Secondary | ICD-10-CM | POA: Diagnosis present

## 2016-12-10 DIAGNOSIS — Z79899 Other long term (current) drug therapy: Secondary | ICD-10-CM | POA: Diagnosis not present

## 2016-12-10 LAB — BASIC METABOLIC PANEL
ANION GAP: 8 (ref 5–15)
BUN: 8 mg/dL (ref 6–20)
CALCIUM: 8.7 mg/dL — AB (ref 8.9–10.3)
CHLORIDE: 105 mmol/L (ref 101–111)
CO2: 26 mmol/L (ref 22–32)
CREATININE: 1.12 mg/dL (ref 0.61–1.24)
GFR calc non Af Amer: >60 mL/min (ref >60)
Glucose, Bld: 111 mg/dL — ABNORMAL HIGH (ref 65–99)
Potassium: 3.5 mmol/L (ref 3.5–5.1)
SODIUM: 139 mmol/L (ref 135–145)

## 2016-12-10 LAB — RESPIRATORY PANEL BY PCR
Adenovirus: NOT DETECTED
Bordetella pertussis: NOT DETECTED
CORONAVIRUS OC43-RVPPCR: NOT DETECTED
Chlamydophila pneumoniae: NOT DETECTED
Coronavirus 229E: NOT DETECTED
Coronavirus HKU1: NOT DETECTED
Coronavirus NL63: NOT DETECTED
INFLUENZA A H3-RVPPCR: DETECTED — AB
INFLUENZA B-RVPPCR: NOT DETECTED
METAPNEUMOVIRUS-RVPPCR: NOT DETECTED
MYCOPLASMA PNEUMONIAE-RVPPCR: NOT DETECTED
PARAINFLUENZA VIRUS 1-RVPPCR: NOT DETECTED
PARAINFLUENZA VIRUS 2-RVPPCR: NOT DETECTED
PARAINFLUENZA VIRUS 4-RVPPCR: NOT DETECTED
Parainfluenza Virus 3: NOT DETECTED
RESPIRATORY SYNCYTIAL VIRUS-RVPPCR: NOT DETECTED
RHINOVIRUS / ENTEROVIRUS - RVPPCR: NOT DETECTED

## 2016-12-10 LAB — CBC
HCT: 37.8 % — ABNORMAL LOW (ref 39.0–52.0)
HEMOGLOBIN: 12.5 g/dL — AB (ref 13.0–17.0)
MCH: 26.9 pg (ref 26.0–34.0)
MCHC: 33.1 g/dL (ref 30.0–36.0)
MCV: 81.5 fL (ref 78.0–100.0)
PLATELETS: 138 10*3/uL — AB (ref 150–400)
RBC: 4.64 MIL/uL (ref 4.22–5.81)
RDW: 14.2 % (ref 11.5–15.5)
WBC: 7.5 10*3/uL (ref 4.0–10.5)

## 2016-12-10 LAB — INFLUENZA PANEL BY PCR (TYPE A & B)
Influenza A By PCR: POSITIVE — AB
Influenza B By PCR: NEGATIVE

## 2016-12-10 LAB — LACTIC ACID, PLASMA: Lactic Acid, Venous: 0.9 mmol/L (ref 0.5–1.9)

## 2016-12-10 LAB — GLUCOSE, CAPILLARY: GLUCOSE-CAPILLARY: 86 mg/dL (ref 65–99)

## 2016-12-10 MED ORDER — POTASSIUM CHLORIDE IN NACL 20-0.9 MEQ/L-% IV SOLN
INTRAVENOUS | Status: DC
  Administered 2016-12-10 – 2016-12-11 (×2): via INTRAVENOUS
  Filled 2016-12-10 (×2): qty 1000

## 2016-12-10 MED ORDER — LORATADINE 10 MG PO TABS
10.0000 mg | ORAL_TABLET | Freq: Every day | ORAL | Status: DC
  Filled 2016-12-10 (×2): qty 1

## 2016-12-10 MED ORDER — IBUPROFEN 200 MG PO TABS
400.0000 mg | ORAL_TABLET | Freq: Three times a day (TID) | ORAL | Status: DC
  Administered 2016-12-10 – 2016-12-11 (×4): 400 mg via ORAL
  Filled 2016-12-10 (×3): qty 2

## 2016-12-10 NOTE — Progress Notes (Signed)
PROGRESS NOTE    Brian Willis  ZOX:[id number] DOB: 1946-02-27 DOA: 12/09/2016 PCP: No PCP Per Patient   Brief Narrative: 71 y.o. male with a past medical history significant for HTN who presents with syncope and URI/fever.  Assessment & Plan:  #  Influenza A, acute bronchitis:  -Patient with fever, dry cough, malaise, nausea, 1 episode of diarrhea contributed by influenza. Patient is still feels weak and coughing and reported not feeling better. Patient with fever last night. -Continue Tamiflu -Start Motrin, Mucinex, Claritin -Continue IV fluid and supportive care -Droplet precaution  #Possible syncopal episode on admission: Likely in the setting of influenza A and dehydration. Currently on IV fluid. -Chest x-ray clear -UA negative for UTI -Patient has no focal neurological deficit -Orthostatic blood pressure normal -Continue supportive care. -PT, OT evaluation.  #History of hypertension: Continue to hold lisinopril and hydrochlorothiazide. Monitor blood pressure.  Principal Problem:   Influenza-like illness Active Problems:   Essential hypertension   Syncope  DVT prophylaxis:Lovenox subcutaneous  Code Status:Full code  Family Communication:No family present at bedside  Disposition Plan:Likely discharge home tomorrow if patient feels better    Consultants:   None  Procedures:None  Antimicrobials:Tamiflu  Subjective: Patient was seen and examined at bedside. Patient reported not feeling well. Has generalized muscle pain, nausea, dry cough and weakness. Denied chest pain, shortness of breath, headache, dizziness.  Objective: Vitals:   12/09/16 2030 12/09/16 2137 12/10/16 0214 12/10/16 0353  BP: 128/82 127/75  118/71  Pulse: 79 79  69  Resp: 26 18  18   Temp: 100.2 F (37.9 C) (!) 100.7 F (38.2 C) 99.2 F (37.3 C) 99.1 F (37.3 C)  TempSrc: Oral Oral Oral Oral  SpO2: 98% 97%  97%  Weight:  63.6 kg (140 lb 3.2 oz)  64.9 kg (143 lb)  Height:  5\' 9"  (1.753 m)       Intake/Output Summary (Last 24 hours) at 12/10/16 1046 Last data filed at 12/10/16 0800  Gross per 24 hour  Intake             2840 ml  Output             2300 ml  Net              540 ml   Filed Weights   12/09/16 2137 12/10/16 0353  Weight: 63.6 kg (140 lb 3.2 oz) 64.9 kg (143 lb)    Examination:  General exam: Appears calm and comfortable  Respiratory system: Clear to auscultation. Respiratory effort normal. No wheezing or crackle Cardiovascular system: S1 & S2 heard, RRR.  No pedal edema. Gastrointestinal system: Abdomen is nondistended, soft and nontender. Normal bowel sounds heard. Central nervous system: Alert and oriented. No focal neurological deficits. Extremities: Symmetric 5 x 5 power. Skin: No rashes, lesions or ulcers Psychiatry: Judgement and insight appear normal. Mood & affect appropriate.     Data Reviewed: I have personally reviewed following labs and imaging studies  CBC:  Recent Labs Lab 12/09/16 1222 12/10/16 0206  WBC 7.4 7.5  HGB 14.2 12.5*  HCT 42.3 37.8*  MCV 81.3 81.5  PLT 123* 138*   Basic Metabolic Panel:  Recent Labs Lab 12/09/16 1222 12/10/16 0206  NA 135 139  K 3.9 3.5  CL 100* 105  CO2 28 26  GLUCOSE 116* 111*  BUN 15 8  CREATININE 1.25* 1.12  CALCIUM 9.5 8.7*   GFR: Estimated Creatinine Clearance (by C-G formula based on SCr of 1.12 mg/dL) Male: 81.1 mL/min  Male: 56.3 mL/min Liver Function Tests:  Recent Labs Lab 12/09/16 1222  AST 62*  ALT 41  ALKPHOS 59  BILITOT 0.6  PROT 7.8  ALBUMIN 4.5   No results for input(s): LIPASE, AMYLASE in the last 168 hours. No results for input(s): AMMONIA in the last 168 hours. Coagulation Profile: No results for input(s): INR, PROTIME in the last 168 hours. Cardiac Enzymes:  Recent Labs Lab 12/09/16 1222  TROPONINI <0.03   BNP (last 3 results) No results for input(s): PROBNP in the last 8760 hours. HbA1C: No results for input(s): HGBA1C in the last 72  hours. CBG:  Recent Labs Lab 12/10/16 0644  GLUCAP 86   Lipid Profile: No results for input(s): CHOL, HDL, LDLCALC, TRIG, CHOLHDL, LDLDIRECT in the last 72 hours. Thyroid Function Tests:  Recent Labs  12/09/16 2235  TSH 0.387   Anemia Panel: No results for input(s): VITAMINB12, FOLATE, FERRITIN, TIBC, IRON, RETICCTPCT in the last 72 hours. Sepsis Labs:  Recent Labs Lab 12/09/16 1228 12/09/16 2004 12/10/16 0206  LATICACIDVEN 1.98* 2.06* 0.9    No results found for this or any previous visit (from the past 240 hour(s)).       Radiology Studies: Dg Chest 2 View  Result Date: 12/09/2016 CLINICAL DATA:  Cough EXAM: CHEST  2 VIEW COMPARISON:  None. FINDINGS: Mild hyperinflation. Heart and mediastinal contours are within normal limits. No focal opacities or effusions. No acute bony abnormality. IMPRESSION: Hyperinflation.  No active cardiopulmonary disease. Electronically Signed   By: Charlett NoseKevin  Dover M.D.   On: 12/09/2016 12:14        Scheduled Meds: . enoxaparin (LOVENOX) injection  40 mg Subcutaneous Q24H  . ibuprofen  400 mg Oral TID  . loratadine  10 mg Oral Daily  . oseltamivir  75 mg Oral BID  . sodium chloride flush  3 mL Intravenous Q12H   Continuous Infusions: . 0.9 % NaCl with KCl 20 mEq / L       LOS: 0 days    Robbyn Hodkinson Jaynie CollinsPrasad Ashok Sawaya, MD [facility] Pager [phone]  If 7PM-7AM, please contact night-coverage [email or web address] Password [email or web address] 12/10/2016, 10:46 AM

## 2016-12-10 NOTE — Care Management Obs Status (Signed)
MEDICARE OBSERVATION STATUS NOTIFICATION   Patient Details  Name: Brian Willis MRN: [id number] Date of Birth: 11/12/1945   Medicare Observation Status Notification Given:  Yes    Darrold SpanWebster, Damien Batty Hall, RN 12/10/2016, 2:47 PM

## 2016-12-11 LAB — GLUCOSE, CAPILLARY: GLUCOSE-CAPILLARY: 92 mg/dL (ref 65–99)

## 2016-12-11 MED ORDER — IBUPROFEN 400 MG PO TABS: 400.0000 mg | ORAL_TABLET | Freq: Four times a day (QID) | ORAL | 0 refills | Status: AC | PRN

## 2016-12-11 MED ORDER — GUAIFENESIN-DM 100-10 MG/5ML PO SYRP: 5.0000 mL | ORAL_SOLUTION | ORAL | 0 refills | Status: DC | PRN

## 2016-12-11 MED ORDER — OSELTAMIVIR PHOSPHATE 75 MG PO CAPS: 75.0000 mg | ORAL_CAPSULE | Freq: Two times a day (BID) | ORAL | 0 refills | Status: AC

## 2016-12-11 NOTE — Discharge Summary (Signed)
Physician Discharge Summary  Brian Willis NWG:[id number] DOB: Aug 14, 1946 DOA: 12/09/2016  PCP: No PCP Per Patient  Admit date: 12/09/2016 Discharge date: 12/11/2016  Admitted From:home Disposition:home  Recommendations for Outpatient Follow-up:  1. Follow up with PCP in 1-2 weeks 2. Please obtain BMP/CBC in one week   Home Health:no Equipment/Devices:no Discharge Condition:stable CODE STATUS:full Diet recommendation:Heart healthy  Brief/Interim Summary:70 y.o.malewith a past medical history significant for HTNwho presents with syncope and URI/fever. Patient was found to have influenza A positive possibly causing acute bronchitis. Treated with Tamiflu, IV fluid, Motrin, Mucinex, Claritin. Patient with significant clinical improvement. There was concern of possible syncopal episode on admission likely in the setting of influenza and dehydration. Chest x-ray unremarkable, UA negative for UTI. Patient has no focal neurological deficit. Orthostatic blood pressure normal. Patient continues to feel better. He is afebrile. Able to tolerate diet. Denied chest pain, shortness of breath, cough or nausea vomiting. Patient is discharged home in stable condition. I advised patient to follow-up with his PCP in a week.   Discharge Diagnoses:  Principal Problem:   Influenza-like illness Active Problems:   Essential hypertension   Syncope    Discharge Instructions  Discharge Instructions    Call MD for:  difficulty breathing, headache or visual disturbances    Complete by:  As directed    Call MD for:  extreme fatigue    Complete by:  As directed    Call MD for:  hives    Complete by:  As directed    Call MD for:  persistant dizziness or light-headedness    Complete by:  As directed    Call MD for:  persistant nausea and vomiting    Complete by:  As directed    Call MD for:  severe uncontrolled pain    Complete by:  As directed    Call MD for:  temperature >100.4    Complete by:  As  directed    Diet - low sodium heart healthy    Complete by:  As directed    Increase activity slowly    Complete by:  As directed      Allergies as of 12/11/2016   No Active Allergies     Medication List    TAKE these medications   guaiFENesin-dextromethorphan 100-10 MG/5ML syrup Commonly known as:  ROBITUSSIN DM Take 5 mLs by mouth every 4 (four) hours as needed for cough.   hydrochlorothiazide 25 MG tablet Commonly known as:  HYDRODIURIL Take 1 tablet (25 mg total) by mouth daily.   ibuprofen 400 MG tablet Commonly known as:  ADVIL,MOTRIN Take 1 tablet (400 mg total) by mouth every 6 (six) hours as needed.   lisinopril 10 MG tablet Commonly known as:  PRINIVIL,ZESTRIL Take 1 tablet (10 mg total) by mouth daily.   oseltamivir 75 MG capsule Commonly known as:  TAMIFLU Take 1 capsule (75 mg total) by mouth 2 (two) times daily.      Follow-up Information    [facility]. Schedule an appointment as soon as possible for a visit in 1 week(s).   Why:  or follow up with your PCP Contact information: [address] [place] [address] [phone]         No Active Allergies  Consultations: None  Procedures/Studies: None  Subjective: Patient was seen and examined at bedside. Reported feeling better. Denied fever, chills, headache, dizziness, nausea, vomiting, chest pain, shortness of breath. Verbalized understanding.   Discharge Exam: Vitals:  12/10/16 2034 12/11/16 0607  BP: 139/78 (!) 143/82  Pulse: 60 (!) 56  Resp: 18 18  Temp: 98.1 F (36.7 C) 98.1 F (36.7 C)   Vitals:   12/10/16 0353 12/10/16 1345 12/10/16 2034 12/11/16 0607  BP: 118/71 132/69 139/78 (!) 143/82  Pulse: 69 71 60 (!) 56  Resp: 18 18 18 18   Temp: 99.1 F (37.3 C) 99 F (37.2 C) 98.1 F (36.7 C) 98.1 F (36.7 C)  TempSrc: Oral Oral Oral Oral  SpO2: 97% 98% 97% 98%  Weight: 64.9 kg (143 lb)   63.9 kg (140 lb 14.4 oz)  Height:         General: Pt is alert, awake, not in acute distress Cardiovascular: RRR, S1/S2 +, no rubs, no gallops Respiratory: CTA bilaterally, no wheezing, no rhonchi Abdominal: Soft, NT, ND, bowel sounds + Extremities: no edema, no cyanosis Neurology: Alert, awake, following commands.   The results of significant diagnostics from this hospitalization (including imaging, microbiology, ancillary and laboratory) are listed below for reference.     Microbiology: Recent Results (from the past 240 hour(s))  Respiratory Panel by PCR     Status: Abnormal   Collection Time: 12/09/16 11:03 PM  Result Value Ref Range Status   Adenovirus NOT DETECTED NOT DETECTED Final   Coronavirus 229E NOT DETECTED NOT DETECTED Final   Coronavirus HKU1 NOT DETECTED NOT DETECTED Final   Coronavirus NL63 NOT DETECTED NOT DETECTED Final   Coronavirus OC43 NOT DETECTED NOT DETECTED Final   Metapneumovirus NOT DETECTED NOT DETECTED Final   Rhinovirus / Enterovirus NOT DETECTED NOT DETECTED Final   Influenza A H3 DETECTED (A) NOT DETECTED Final   Influenza B NOT DETECTED NOT DETECTED Final   Parainfluenza Virus 1 NOT DETECTED NOT DETECTED Final   Parainfluenza Virus 2 NOT DETECTED NOT DETECTED Final   Parainfluenza Virus 3 NOT DETECTED NOT DETECTED Final   Parainfluenza Virus 4 NOT DETECTED NOT DETECTED Final   Respiratory Syncytial Virus NOT DETECTED NOT DETECTED Final   Bordetella pertussis NOT DETECTED NOT DETECTED Final   Chlamydophila pneumoniae NOT DETECTED NOT DETECTED Final   Mycoplasma pneumoniae NOT DETECTED NOT DETECTED Final     Labs: BNP (last 3 results) No results for input(s): BNP in the last 8760 hours. Basic Metabolic Panel:  Recent Labs Lab 12/09/16 1222 12/10/16 0206  NA 135 139  K 3.9 3.5  CL 100* 105  CO2 28 26  GLUCOSE 116* 111*  BUN 15 8  CREATININE 1.25* 1.12  CALCIUM 9.5 8.7*   Liver Function Tests:  Recent Labs Lab 12/09/16 1222  AST 62*  ALT 41  ALKPHOS 59   BILITOT 0.6  PROT 7.8  ALBUMIN 4.5   No results for input(s): LIPASE, AMYLASE in the last 168 hours. No results for input(s): AMMONIA in the last 168 hours. CBC:  Recent Labs Lab 12/09/16 1222 12/10/16 0206  WBC 7.4 7.5  HGB 14.2 12.5*  HCT 42.3 37.8*  MCV 81.3 81.5  PLT 123* 138*   Cardiac Enzymes:  Recent Labs Lab 12/09/16 1222  TROPONINI <0.03   BNP: Invalid input(s): POCBNP CBG:  Recent Labs Lab 12/10/16 0644 12/11/16 0705  GLUCAP 86 92   D-Dimer No results for input(s): DDIMER in the last 72 hours. Hgb A1c No results for input(s): HGBA1C in the last 72 hours. Lipid Profile No results for input(s): CHOL, HDL, LDLCALC, TRIG, CHOLHDL, LDLDIRECT in the last 72 hours. Thyroid function studies  Recent Labs  12/09/16 2235  TSH  0.387   Anemia work up No results for input(s): VITAMINB12, FOLATE, FERRITIN, TIBC, IRON, RETICCTPCT in the last 72 hours. Urinalysis    Component Value Date/Time   COLORURINE YELLOW 12/09/2016 1339   APPEARANCEUR CLEAR 12/09/2016 1339   LABSPEC 1.021 12/09/2016 1339   PHURINE 5.5 12/09/2016 1339   GLUCOSEU NEGATIVE 12/09/2016 1339   HGBUR SMALL (A) 12/09/2016 1339   BILIRUBINUR NEGATIVE 12/09/2016 1339   BILIRUBINUR negative 09/22/2016 1226   BILIRUBINUR neg 12/17/2014 1333   KETONESUR NEGATIVE 12/09/2016 1339   PROTEINUR NEGATIVE 12/09/2016 1339   UROBILINOGEN 0.2 09/22/2016 1226   NITRITE NEGATIVE 12/09/2016 1339   LEUKOCYTESUR NEGATIVE 12/09/2016 1339   Sepsis Labs Invalid input(s): PROCALCITONIN,  WBC,  LACTICIDVEN Microbiology Recent Results (from the past 240 hour(s))  Respiratory Panel by PCR     Status: Abnormal   Collection Time: 12/09/16 11:03 PM  Result Value Ref Range Status   Adenovirus NOT DETECTED NOT DETECTED Final   Coronavirus 229E NOT DETECTED NOT DETECTED Final   Coronavirus HKU1 NOT DETECTED NOT DETECTED Final   Coronavirus NL63 NOT DETECTED NOT DETECTED Final   Coronavirus OC43 NOT DETECTED  NOT DETECTED Final   Metapneumovirus NOT DETECTED NOT DETECTED Final   Rhinovirus / Enterovirus NOT DETECTED NOT DETECTED Final   Influenza A H3 DETECTED (A) NOT DETECTED Final   Influenza B NOT DETECTED NOT DETECTED Final   Parainfluenza Virus 1 NOT DETECTED NOT DETECTED Final   Parainfluenza Virus 2 NOT DETECTED NOT DETECTED Final   Parainfluenza Virus 3 NOT DETECTED NOT DETECTED Final   Parainfluenza Virus 4 NOT DETECTED NOT DETECTED Final   Respiratory Syncytial Virus NOT DETECTED NOT DETECTED Final   Bordetella pertussis NOT DETECTED NOT DETECTED Final   Chlamydophila pneumoniae NOT DETECTED NOT DETECTED Final   Mycoplasma pneumoniae NOT DETECTED NOT DETECTED Final     Time coordinating discharge: 25 minutes  SIGNED:   Maxie Barbron Prasad Bhandari, MD  [facility] 12/11/2016, 10:55 AM  If 7PM-7AM, please contact night-coverage [email or web address] Password [email or web address]

## 2016-12-11 NOTE — Discharge Instructions (Signed)

## 2016-12-11 NOTE — Progress Notes (Signed)
Patient educated on DC instructions. Has no questions or concerns. Will continue to monitor for any changes,.

## 2016-12-17 ENCOUNTER — Encounter: Payer: Self-pay | Admitting: Physician Assistant

## 2016-12-17 ENCOUNTER — Other Ambulatory Visit: Payer: Self-pay | Admitting: Physician Assistant

## 2016-12-17 ENCOUNTER — Ambulatory Visit: Payer: Medicare Other | Attending: Internal Medicine | Admitting: Physician Assistant

## 2016-12-17 ENCOUNTER — Telehealth: Payer: Self-pay | Admitting: General Practice

## 2016-12-17 ENCOUNTER — Ambulatory Visit: Payer: Medicare Other

## 2016-12-17 VITALS — BP 150/91 | HR 67 | Temp 97.9°F | Resp 16 | Wt 140.6 lb

## 2016-12-17 DIAGNOSIS — I10 Essential (primary) hypertension: Secondary | ICD-10-CM

## 2016-12-17 DIAGNOSIS — R059 Cough, unspecified: Secondary | ICD-10-CM

## 2016-12-17 DIAGNOSIS — E785 Hyperlipidemia, unspecified: Secondary | ICD-10-CM | POA: Insufficient documentation

## 2016-12-17 DIAGNOSIS — R05 Cough: Secondary | ICD-10-CM | POA: Diagnosis not present

## 2016-12-17 DIAGNOSIS — Z79899 Other long term (current) drug therapy: Secondary | ICD-10-CM | POA: Diagnosis not present

## 2016-12-17 DIAGNOSIS — E079 Disorder of thyroid, unspecified: Secondary | ICD-10-CM | POA: Diagnosis not present

## 2016-12-17 DIAGNOSIS — Z13220 Encounter for screening for lipoid disorders: Secondary | ICD-10-CM | POA: Diagnosis not present

## 2016-12-17 DIAGNOSIS — D649 Anemia, unspecified: Secondary | ICD-10-CM

## 2016-12-17 LAB — CBC WITH DIFFERENTIAL/PLATELET
BASOS PCT: 0 %
Basophils Absolute: 0 cells/uL (ref 0–200)
Eosinophils Absolute: 216 cells/uL (ref 15–500)
Eosinophils Relative: 2 %
HEMATOCRIT: 42.5 % (ref 38.5–50.0)
HEMOGLOBIN: 14.1 g/dL (ref 13.2–17.1)
LYMPHS ABS: 2268 {cells}/uL (ref 850–3900)
Lymphocytes Relative: 21 %
MCH: 27 pg (ref 27.0–33.0)
MCHC: 33.2 g/dL (ref 32.0–36.0)
MCV: 81.3 fL (ref 80.0–100.0)
MONO ABS: 864 {cells}/uL (ref 200–950)
MPV: 10.2 fL (ref 7.5–12.5)
Monocytes Relative: 8 %
NEUTROS PCT: 69 %
Neutro Abs: 7452 cells/uL (ref 1500–7800)
Platelets: 281 10*3/uL (ref 140–400)
RBC: 5.23 MIL/uL (ref 4.20–5.80)
RDW: 14.2 % (ref 11.0–15.0)
WBC: 10.8 10*3/uL (ref 3.8–10.8)

## 2016-12-17 LAB — BASIC METABOLIC PANEL
BUN: 11 mg/dL (ref 7–25)
CO2: 26 mmol/L (ref 20–31)
Calcium: 9.9 mg/dL (ref 8.6–10.3)
Chloride: 102 mmol/L (ref 98–110)
Creat: 1.05 mg/dL (ref 0.70–1.18)
GLUCOSE: 93 mg/dL (ref 65–99)
POTASSIUM: 3.9 mmol/L (ref 3.5–5.3)
SODIUM: 139 mmol/L (ref 135–146)

## 2016-12-17 MED ORDER — BENZONATATE 200 MG PO CAPS: 200.0000 mg | ORAL_CAPSULE | Freq: Two times a day (BID) | ORAL | 0 refills | Status: DC | PRN

## 2016-12-17 MED ORDER — LISINOPRIL-HYDROCHLOROTHIAZIDE 20-25 MG PO TABS: 1.0000 | ORAL_TABLET | Freq: Every day | ORAL | 3 refills | Status: DC

## 2016-12-17 NOTE — Telephone Encounter (Signed)
Returned pt call. If pt calls back could you make him aware that we will add on the cholesterol but we will need him to come sign a waiver stating that if medicare doesn't pay for the cost of the lab that he will be responsible for it

## 2016-12-17 NOTE — Patient Instructions (Signed)
Check blood pressure 3 times per week and record and bring to next visit. 

## 2016-12-17 NOTE — Addendum Note (Signed)
Addended by: Paschal DoppWHITE, VANESSA J on: 12/17/2016 03:11 PM   Modules accepted: Orders

## 2016-12-17 NOTE — Telephone Encounter (Signed)
Patient called the office to speak with Dr. Sharon SellerMcClung in regards to his lab work. Pt would like to provider to check his cholesterol levels. Please follow up.  Thank you.

## 2016-12-17 NOTE — Progress Notes (Signed)
Jawanza Zambito, is a 71 y.o. male  VWU:[id number]  GNF:[id number]  DOB - Dec 25, 1945  Subjective:  Chief Complaint and HPI: Brooks Kinnan is a 71 y.o. male here today to establish care and for a follow up visit after recent hospitalization for syncopal episodes with negative cardiac work-up and ultimately being diagnosed and treated for influenza and dehydration 12/09/2016-12/11/2016.  He is feeling much better. He still has some residual cough that is non-productive.  No f/c since being in the hospital.  He finished his 5 day course of Tamiflu out of the hospital.    His BP hasn't been controlled "for a while."  He is compliant with his medications.  He denies CP/SOB/HA.  ED/Hospital notes reviewed.     ROS:   Constitutional:  No f/c, No night sweats, No unexplained weight loss. EENT:  No vision changes, No blurry vision, No hearing changes. No mouth, throat, or ear problems.  Respiratory: Mild cough, No SOB Cardiac: No CP, no palpitations GI:  No abd pain, No N/V/D. GU: No Urinary s/sx Musculoskeletal: No joint pain Neuro: No headache, no dizziness, no motor weakness.  Skin: No rash Endocrine:  No polydipsia. No polyuria.  Psych: Denies SI/HI  No problems updated.  ALLERGIES: No Active Allergies  PAST MEDICAL HISTORY: Past Medical History:  Diagnosis Date  . HTN (hypertension)   . Hyperlipidemia   . Thyroid disease     MEDICATIONS AT HOME: Prior to Admission medications   Medication Sig Start Date End Date Taking? Authorizing Provider  benzonatate (TESSALON) 200 MG capsule Take 1 capsule (200 mg total) by mouth 2 (two) times daily as needed for cough. 12/17/16   Anders Simmonds, PA-C  guaiFENesin-dextromethorphan (ROBITUSSIN DM) 100-10 MG/5ML syrup Take 5 mLs by mouth every 4 (four) hours as needed for cough. Patient not taking: Reported on 12/17/2016 12/11/16   Dron Jaynie Collins, MD  ibuprofen (ADVIL,MOTRIN) 400 MG tablet Take 1 tablet (400 mg total) by mouth every 6 (six)  hours as needed. Patient not taking: Reported on 12/17/2016 12/11/16   Dron Jaynie Collins, MD  lisinopril-hydrochlorothiazide (PRINZIDE,ZESTORETIC) 20-25 MG tablet Take 1 tablet by mouth daily. 12/17/16   Anders Simmonds, PA-C     Objective:  EXAM:   Vitals:   12/17/16 1021  BP: (!) 150/91  Pulse: 67  Resp: 16  Temp: 97.9 F (36.6 C)  TempSrc: Oral  SpO2: 97%  Weight: 140 lb 9.6 oz (63.8 kg)    General appearance : A&OX3. NAD. Non-toxic-appearing HEENT: Atraumatic and Normocephalic.  PERRLA. EOM intact.   Neck: supple, no JVD. No cervical lymphadenopathy. No thyromegaly Chest/Lungs:  Breathing-non-labored, Good air entry bilaterally, breath sounds normal without rales, rhonchi, or wheezing  CVS: S1 S2 regular, no murmurs, gallops, rubs  Extremities: Bilateral Lower Ext shows no edema, both legs are warm to touch with = pulse throughout Neurology:  CN II-XII grossly intact, Non focal.   Psych:  TP linear. J/I WNL. Normal speech. Appropriate eye contact and affect.  Skin:  No Rash  Data Review No results found for: HGBA1C   Assessment & Plan   1. Hypertension, unspecified type Increase dose- lisinopril-hydrochlorothiazide (PRINZIDE,ZESTORETIC) 20-25 MG tablet; Take 1 tablet by mouth daily.  Dispense: 90 tablet; Refill: 3 - Basic metabolic panel Check blood pressure 3 times per week and record and bring to next visit  2. Cough Post-influenza and post hospitalization - benzonatate (TESSALON) 200 MG capsule; Take 1 capsule (200 mg total) by mouth 2 (two) times daily as  needed for cough.  Dispense: 20 capsule; Refill: 0  3. Low hemoglobin during illness-recheck today - CBC with Differential/Platelet   Patient have been counseled extensively about nutrition and exercise  Return in about 3 weeks (around 01/07/2017) for assign PCP; f/up htn.  The patient was given clear instructions to go to ER or return to medical center if symptoms don't improve, worsen or new problems  develop. The patient verbalized understanding. The patient was told to call to get lab results if they haven't heard anything in the next week.     Georgian CoAngela Keyona Emrich, PA-C [facility] [place], [place] [phone]   12/17/2016, 10:52 AMPatient ID: Ephriam Knucklesndrew Dolinar, male   DOB: 01/08/1946, 71 y.o.   MRN: [id number]

## 2016-12-19 LAB — LIPID PANEL
Cholesterol: 223 mg/dL — ABNORMAL HIGH (ref <200)
HDL: 60 mg/dL (ref >40)
LDL CALC: 141 mg/dL — AB (ref <100)
TRIGLYCERIDES: 110 mg/dL (ref <150)
Total CHOL/HDL Ratio: 3.7 Ratio (ref <5.0)
VLDL: 22 mg/dL (ref <30)

## 2016-12-23 ENCOUNTER — Telehealth: Payer: Self-pay

## 2016-12-23 NOTE — Telephone Encounter (Signed)
Contacted pt to go over lab results pt is aware of results and doesn't have any questions or concerns 

## 2017-01-13 ENCOUNTER — Telehealth: Payer: Self-pay | Admitting: Internal Medicine

## 2017-01-13 NOTE — Telephone Encounter (Signed)
Returned call to patient. Pt states he still has flu sx's. He c/o sore throat,  Runny nose, HA, hoarseness, productive cough. States he has a yellowish / white phlegm and mucous. Pt is not SOB, NAD noted in conversation. Pt states he does not want to come in to be seen by provider despite how many times it was suggested by Clinical research associatewriter. Also suggested to patient to go to pharmacy to take OTC  Medications that the pharmacist would approve of for allergies to help with runny nose. Pt also refused this recommendation. He states he will wait until the appeals department with Medicaid is filed, upset about being prescribed Tamiflu and medication not helping. Explained to pt Tamiflu was not prescribed to cure influenza.  Informed patient that the process would not affect his office visit to be treated for sx's to feel better.  He is adamant he does not want to be seen by provider.

## 2017-01-13 NOTE — Telephone Encounter (Signed)
Patient called the office to speak with nurse regarding the flu symptoms he is still experiencing after his hospital visit and hospital follow up with us. Pt can't take tami flu. Pt wants to know if there's other options available (of medication) that he can take. Please follow up.  Thank you.

## 2017-01-14 ENCOUNTER — Telehealth: Payer: Self-pay | Admitting: General Practice

## 2017-01-14 NOTE — Telephone Encounter (Signed)
Patient called the office regarding his lab work. [facility] informed patient that lab work was put under the wrong coding. Please contact [facility] [phone]. Explained to patient that [facility] usually faxes us that information. Pt asked for us to contact them.  Thank you.

## 2017-01-15 NOTE — Telephone Encounter (Signed)
FYI.Marland Kitchen.Marland Kitchen.I called [facility] and Erie NoeVanessa had already called and taken care of this.  Thanks! Georgian CoAngela McClung

## 2017-01-18 ENCOUNTER — Ambulatory Visit: Payer: Medicare Other | Admitting: Family Medicine

## 2017-03-29 DIAGNOSIS — H2513 Age-related nuclear cataract, bilateral: Secondary | ICD-10-CM | POA: Diagnosis not present

## 2017-03-29 DIAGNOSIS — E0500 Thyrotoxicosis with diffuse goiter without thyrotoxic crisis or storm: Secondary | ICD-10-CM | POA: Diagnosis not present

## 2017-03-29 DIAGNOSIS — H02056 Trichiasis without entropian left eye, unspecified eyelid: Secondary | ICD-10-CM | POA: Diagnosis not present

## 2017-03-29 DIAGNOSIS — H02052 Trichiasis without entropian right lower eyelid: Secondary | ICD-10-CM | POA: Diagnosis not present

## 2017-03-29 DIAGNOSIS — H02055 Trichiasis without entropian left lower eyelid: Secondary | ICD-10-CM | POA: Diagnosis not present

## 2017-03-29 DIAGNOSIS — H02053 Trichiasis without entropian right eye, unspecified eyelid: Secondary | ICD-10-CM | POA: Diagnosis not present

## 2017-04-07 ENCOUNTER — Encounter: Payer: Self-pay | Admitting: Emergency Medicine

## 2017-04-07 ENCOUNTER — Ambulatory Visit (INDEPENDENT_AMBULATORY_CARE_PROVIDER_SITE_OTHER): Payer: Medicare Other | Admitting: Emergency Medicine

## 2017-04-07 VITALS — BP 145/74 | HR 62 | Temp 98.2°F | Resp 18 | Ht 69.0 in | Wt 141.2 lb

## 2017-04-07 DIAGNOSIS — I10 Essential (primary) hypertension: Secondary | ICD-10-CM

## 2017-04-07 MED ORDER — AMLODIPINE BESYLATE 5 MG PO TABS: 5.0000 mg | ORAL_TABLET | Freq: Every day | ORAL | 3 refills | Status: DC

## 2017-04-07 NOTE — Patient Instructions (Addendum)
Stop Lisinopril/HCTZ and start Amlodipine 5mg .   IF you received an x-ray today, you will receive an invoice from [facility]. Please contact [facility] at [phone] with questions or concerns regarding your invoice.   IF you received labwork today, you will receive an invoice from [place]. Please contact [facility] at [phone] with questions or concerns regarding your invoice.   Our billing staff will not be able to assist you with questions regarding bills from these companies.  You will be contacted with the lab results as soon as they are available. The fastest way to get your results is to activate your My Chart account. Instructions are located on the last page of this paperwork. If you have not heard from [place] regarding the results in 2 weeks, please contact this office.      How to Take Your Blood Pressure You can take your blood pressure at home with a machine. You may need to check your blood pressure at home:  To check if you have high blood pressure (hypertension).  To check your blood pressure over time.  To make sure your blood pressure medicine is working. Supplies needed: You will need a blood pressure machine, or monitor. You can buy one at a drugstore or online. When choosing one:  Choose one with an arm cuff.  Choose one that wraps around your upper arm. Only one finger should fit between your arm and the cuff.  Do not choose one that measures your blood pressure from your wrist or finger. Your doctor can suggest a monitor. How to prepare Avoid these things for 30 minutes before checking your blood pressure:  Drinking caffeine.  Drinking alcohol.  Eating.  Smoking.  Exercising. Five minutes before checking your blood pressure:  Pee.  Sit in a dining chair. Avoid sitting in a soft couch or armchair.  Be quiet. Do not talk. How to take your blood pressure Follow the instructions that came with your machine. If you have a  digital blood pressure monitor, these may be the instructions: 1. Sit up straight. 2. Place your feet on the floor. Do not cross your ankles or legs. 3. Rest your left arm at the level of your heart. You may rest it on a table, desk, or chair. 4. Pull up your shirt sleeve. 5. Wrap the blood pressure cuff around the upper part of your left arm. The cuff should be 1 inch (2.5 cm) above your elbow. It is best to wrap the cuff around bare skin. 6. Fit the cuff snugly around your arm. You should be able to place only one finger between the cuff and your arm. 7. Put the cord inside the groove of your elbow. 8. Press the power button. 9. Sit quietly while the cuff fills with air and loses air. 10. Write down the numbers on the screen. 11. Wait 2-3 minutes and then repeat steps 1-10. What do the numbers mean? Two numbers make up your blood pressure. The first number is called systolic pressure. The second is called diastolic pressure. An example of a blood pressure reading is "120 over 80" (or 120/80). If you are an adult and do not have a medical condition, use this guide to find out if your blood pressure is normal: Normal   First number: below 120.  Second number: below 80. Elevated   First number: 120-129.  Second number: below 80. Hypertension stage 1   First number: 130-139.  Second number: 80-89. Hypertension stage 2  First number: 140 or above.  Second number: 90 or above. Your blood pressure is above normal even if only the top or bottom number is above normal. Follow these instructions at home:  Check your blood pressure as often as your doctor tells you to.  Take your monitor to your next doctor's appointment. Your doctor will:  Make sure you are using it correctly.  Make sure it is working right.  Make sure you understand what your blood pressure numbers should be.  Tell your doctor if your medicines are causing side effects. Contact a doctor if:  Your blood  pressure keeps being high. Get help right away if:  Your first blood pressure number is higher than 180.  Your second blood pressure number is higher than 120. This information is not intended to replace advice given to you by your health care provider. Make sure you discuss any questions you have with your health care provider. Document Released: 10/08/2008 Document Revised: 09/23/2016 Document Reviewed: 04/03/2016 Elsevier Interactive Patient Education  2017 [employer]

## 2017-04-07 NOTE — Progress Notes (Signed)
Brian KnucklesAndrew Willis 71 y.o.   Chief Complaint  Patient presents with  . Medication Reaction    lisinopril is getting rashes and itching from when they increased the dose     HISTORY OF PRESENT ILLNESS: This is a 71 y.o. male complaining of generalized itching x years but worse the past several weeks. States he's had itching since starting BP medication (lisinopril-HCTZ). Denies SOB, chest pain, anaphylaxis, hives, or any other significant symptoms.  HPI   Prior to Admission medications   Medication Sig Start Date End Date Taking? Authorizing Provider  lisinopril-hydrochlorothiazide (PRINZIDE,ZESTORETIC) 20-25 MG tablet Take 1 tablet by mouth daily. 12/17/16  Yes Willis, Brian M, PA-C  benzonatate (TESSALON) 200 MG capsule Take 1 capsule (200 mg total) by mouth 2 (two) times daily as needed for cough. Patient not taking: Reported on 04/07/2017 12/17/16   Brian Willis, Brian M, PA-C  guaiFENesin-dextromethorphan [facility](ROBITUSSIN DM) 100-10 MG/5ML syrup Take 5 mLs by mouth every 4 (four) hours as needed for cough. Patient not taking: Reported on 12/17/2016 12/11/16   Brian Willis, Brian Prasad, MD  ibuprofen (ADVIL,MOTRIN) 400 MG tablet Take 1 tablet (400 mg total) by mouth every 6 (six) hours as needed. Patient not taking: Reported on 12/17/2016 12/11/16   Brian Willis, Brian Prasad, MD    No Active Allergies  Patient Active Problem List   Diagnosis Date Noted  . Syncope 12/09/2016  . Influenza-like illness 12/09/2016  . Cervical disc disorder with radiculopathy of cervical region 03/17/2016  . 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 Graves disease 07/26/2013  . BPH (benign prostatic hyperplasia) 07/26/2013  . Essential hypertension     Past Medical History:  Diagnosis Date  . HTN (hypertension)   . Hyperlipidemia   . Thyroid disease     Past Surgical History:  Procedure Laterality Date  . APPENDECTOMY      Social History   Social History  . Marital status: Single    Spouse name: N/A  .  Number of children: N/A  . Years of education: N/A   Occupational History  . Not on file.   Social History Main Topics  . Smoking status: Never Smoker  . Smokeless tobacco: Never Used  . Alcohol use No  . Drug use: No  . Sexual activity: Not on file   Other Topics Concern  . Not on file   Social History Narrative  . No narrative on file    Family History  Problem Relation Age of Onset  . Hypertension Mother   . Hypertension Father      Review of Systems  Constitutional: Negative.  Negative for chills, fever and malaise/fatigue.  HENT: Negative.  Negative for congestion, ear pain, nosebleeds and sore throat.   Eyes: Negative.  Negative for blurred vision, double vision, discharge and redness.  Respiratory: Negative for cough and shortness of breath.   Cardiovascular: Negative for chest pain, palpitations and leg swelling.  Gastrointestinal: Negative for abdominal pain, diarrhea, nausea and vomiting.  Genitourinary: Negative for dysuria and hematuria.  Musculoskeletal: Negative for myalgias and neck pain.  Skin: Positive for itching. Negative for rash.  Neurological: Negative for dizziness, sensory change, speech change, focal weakness, seizures and headaches.  Endo/Heme/Allergies: Negative.   All other systems reviewed and are negative.  Vitals:   04/07/17 0936  BP: (!) 145/74  Pulse: 62  Resp: 18  Temp: 98.2 F (36.8 C)    Physical Exam  Constitutional: He is oriented to person, place, and time. He appears well-developed and well-nourished.  HENT:  Head: Normocephalic and atraumatic.  Nose: Nose normal.  Mouth/Throat: Oropharynx is clear and moist. No oropharyngeal exudate.  Eyes: Conjunctivae and EOM are normal. Pupils are equal, round, and reactive to light.  Neck: Normal range of motion. Neck supple. No JVD present. No thyromegaly present.  Cardiovascular: Normal rate, regular rhythm, normal heart sounds and intact distal pulses.   Pulmonary/Chest: Effort  normal and breath sounds normal.  Abdominal: Soft. Bowel sounds are normal.  Musculoskeletal: Normal range of motion.  Lymphadenopathy:    He has no cervical adenopathy.  Neurological: He is alert and oriented to person, place, and time. No sensory deficit. He exhibits normal muscle tone.  Skin: Skin is warm and dry. Capillary refill takes less than 2 seconds.  Psychiatric: He has a normal mood and affect. His behavior is normal.  Vitals reviewed.    ASSESSMENT & PLAN: Brian Willis was seen today for medication reaction.  Diagnoses and all orders for this visit:  Essential hypertension, benign  Other orders -     amLODipine (NORVASC) 5 MG tablet; Take 1 tablet (5 mg total) by mouth daily.    Patient Instructions     Stop Lisinopril/HCTZ and start Amlodipine 5mg .   IF you received an x-ray today, you will receive an invoice from [facility]. Please contact [facility] at [phone] with questions or concerns regarding your invoice.   IF you received labwork today, you will receive an invoice from [place]. Please contact [facility] at [phone] with questions or concerns regarding your invoice.   Our billing staff will not be able to assist you with questions regarding bills from these companies.  You will be contacted with the lab results as soon as they are available. The fastest way to get your results is to activate your My Chart account. Instructions are located on the last page of this paperwork. If you have not heard from [place] regarding the results in 2 weeks, please contact this office.      How to Take Your Blood Pressure You can take your blood pressure at home with a machine. You may need to check your blood pressure at home:  To check if you have high blood pressure (hypertension).  To check your blood pressure over time.  To make sure your blood pressure medicine is working. Supplies needed: You will need a blood pressure machine, or monitor.  You can buy one at a drugstore or online. When choosing one:  Choose one with an arm cuff.  Choose one that wraps around your upper arm. Only one finger should fit between your arm and the cuff.  Do not choose one that measures your blood pressure from your wrist or finger. Your doctor can suggest a monitor. How to prepare Avoid these things for 30 minutes before checking your blood pressure:  Drinking caffeine.  Drinking alcohol.  Eating.  Smoking.  Exercising. Five minutes before checking your blood pressure:  Pee.  Sit in a dining chair. Avoid sitting in a soft couch or armchair.  Be quiet. Do not talk. How to take your blood pressure Follow the instructions that came with your machine. If you have a digital blood pressure monitor, these may be the instructions: 1. Sit up straight. 2. Place your feet on the floor. Do not cross your ankles or legs. 3. Rest your left arm at the level of your heart. You may rest it on a table, desk, or chair. 4. Pull up your shirt sleeve. 5. Wrap the blood pressure cuff  around the upper part of your left arm. The cuff should be 1 inch (2.5 cm) above your elbow. It is best to wrap the cuff around bare skin. 6. Fit the cuff snugly around your arm. You should be able to place only one finger between the cuff and your arm. 7. Put the cord inside the groove of your elbow. 8. Press the power button. 9. Sit quietly while the cuff fills with air and loses air. 10. Write down the numbers on the screen. 11. Wait 2-3 minutes and then repeat steps 1-10. What do the numbers mean? Two numbers make up your blood pressure. The first number is called systolic pressure. The second is called diastolic pressure. An example of a blood pressure reading is "120 over 80" (or 120/80). If you are an adult and do not have a medical condition, use this guide to find out if your blood pressure is normal: Normal   First number: below 120.  Second number: below  80. Elevated   First number: 120-129.  Second number: below 80. Hypertension stage 1   First number: 130-139.  Second number: 80-89. Hypertension stage 2   First number: 140 or above.  Second number: 90 or above. Your blood pressure is above normal even if only the top or bottom number is above normal. Follow these instructions at home:  Check your blood pressure as often as your doctor tells you to.  Take your monitor to your next doctor's appointment. Your doctor will:  Make sure you are using it correctly.  Make sure it is working right.  Make sure you understand what your blood pressure numbers should be.  Tell your doctor if your medicines are causing side effects. Contact a doctor if:  Your blood pressure keeps being high. Get help right away if:  Your first blood pressure number is higher than 180.  Your second blood pressure number is higher than 120. This information is not intended to replace advice given to you by your health care provider. Make sure you discuss any questions you have with your health care provider. Document Released: 10/08/2008 Document Revised: 09/23/2016 Document Reviewed: 04/03/2016 Elsevier Interactive Patient Education  2017 Elsevier Inc.      Edwina Barth, MD [facility]

## 2017-04-13 DIAGNOSIS — E0500 Thyrotoxicosis with diffuse goiter without thyrotoxic crisis or storm: Secondary | ICD-10-CM | POA: Diagnosis not present

## 2017-04-13 DIAGNOSIS — H5213 Myopia, bilateral: Secondary | ICD-10-CM | POA: Diagnosis not present

## 2017-04-13 DIAGNOSIS — H52203 Unspecified astigmatism, bilateral: Secondary | ICD-10-CM | POA: Diagnosis not present

## 2017-04-13 DIAGNOSIS — H524 Presbyopia: Secondary | ICD-10-CM | POA: Diagnosis not present

## 2017-04-13 DIAGNOSIS — H02831 Dermatochalasis of right upper eyelid: Secondary | ICD-10-CM | POA: Diagnosis not present

## 2017-04-13 DIAGNOSIS — H25813 Combined forms of age-related cataract, bilateral: Secondary | ICD-10-CM | POA: Diagnosis not present

## 2017-04-13 DIAGNOSIS — H02833 Dermatochalasis of right eye, unspecified eyelid: Secondary | ICD-10-CM | POA: Diagnosis not present

## 2017-04-13 DIAGNOSIS — H1859 Other hereditary corneal dystrophies: Secondary | ICD-10-CM | POA: Diagnosis not present

## 2017-04-13 DIAGNOSIS — H02834 Dermatochalasis of left upper eyelid: Secondary | ICD-10-CM | POA: Diagnosis not present

## 2017-04-13 DIAGNOSIS — H43393 Other vitreous opacities, bilateral: Secondary | ICD-10-CM | POA: Diagnosis not present

## 2017-04-13 DIAGNOSIS — H02832 Dermatochalasis of right lower eyelid: Secondary | ICD-10-CM | POA: Diagnosis not present

## 2017-04-13 DIAGNOSIS — H11823 Conjunctivochalasis, bilateral: Secondary | ICD-10-CM | POA: Diagnosis not present

## 2017-04-13 DIAGNOSIS — H02835 Dermatochalasis of left lower eyelid: Secondary | ICD-10-CM | POA: Diagnosis not present

## 2017-04-13 DIAGNOSIS — H02836 Dermatochalasis of left eye, unspecified eyelid: Secondary | ICD-10-CM | POA: Diagnosis not present

## 2017-05-24 DIAGNOSIS — E0500 Thyrotoxicosis with diffuse goiter without thyrotoxic crisis or storm: Secondary | ICD-10-CM | POA: Diagnosis not present

## 2017-05-28 ENCOUNTER — Telehealth: Payer: Self-pay | Admitting: Emergency Medicine

## 2017-05-28 DIAGNOSIS — E0500 Thyrotoxicosis with diffuse goiter without thyrotoxic crisis or storm: Secondary | ICD-10-CM | POA: Diagnosis not present

## 2017-05-28 NOTE — Telephone Encounter (Signed)
PATIENT SAW DR. SAGARDIA IN MAY FOR HIS BLOOD PRESSURE. HE PRESCRIBED HIM TO HAVE AMLODIPINE (NORVASC) 5 MG. HE WAS SEEN AT [facility] THIS MORNING TO HAVE HIS THYROID CHECKED AND THEY TOOK HIS BLOOD PRESSURE. IT WAS 155/89. PATIENT IS VERY CONCERNED AND SAID "THIS IS TOO HIGH." HE WANTS TO KNOW IF HE SHOULD INCREASE THE MEDICINE? BEST PHONE [phone] (CELL) PHARMACY CHOICE IS [facility] ON [address] IN [place]. MBC

## 2017-05-28 NOTE — Telephone Encounter (Signed)
Dr. Alvy BimlerSagardia, please see this message. Thank you.

## 2017-05-31 NOTE — Telephone Encounter (Signed)
PT CALLED TO SEE WHAT HE WAS SUPPOSE TO DO ABOUT BLOOD PRESSURE MEDICINES I ADVISED PT THAT DR SAGARDIA WANTED HIM TO INCREASE HIS AMLODIINE TO 10 MG DAILY PT UNDERSTOOD WAS UPSET BECAUSE KNOWONE CALLED HIM ABOUT HOW TO TAKE MEDICATION

## 2017-05-31 NOTE — Telephone Encounter (Signed)
Should increase Amlodipine to 10 mg day and schedule OV in 1-2 weeks.

## 2017-06-18 ENCOUNTER — Telehealth: Payer: Self-pay | Admitting: Emergency Medicine

## 2017-06-18 NOTE — Telephone Encounter (Signed)
Pt is calling stating that he is out of his amlodipine BP meds.  Sagardia changed the instructions and wants him to take 2 tablets instead of one so he is out early.  He states that his insurance will not pay unless prior authorized since this is an early refill.  Pt uses the [facility] on [address].  Please advise

## 2017-06-21 ENCOUNTER — Other Ambulatory Visit: Payer: Self-pay | Admitting: Emergency Medicine

## 2017-06-21 MED ORDER — AMLODIPINE BESYLATE 10 MG PO TABS: 10.0000 mg | ORAL_TABLET | Freq: Every day | ORAL | 0 refills | Status: DC

## 2017-06-21 NOTE — Telephone Encounter (Signed)
Amlodopine script changed to 10 mg tablet to take daily. #90, no refills and sent to preferred pharmacy.

## 2017-06-24 ENCOUNTER — Other Ambulatory Visit: Payer: Self-pay | Admitting: Emergency Medicine

## 2017-06-24 MED ORDER — AMLODIPINE BESYLATE 10 MG PO TABS: 10.0000 mg | ORAL_TABLET | Freq: Every day | ORAL | 3 refills | Status: DC

## 2017-07-14 ENCOUNTER — Ambulatory Visit (INDEPENDENT_AMBULATORY_CARE_PROVIDER_SITE_OTHER): Payer: Medicare Other | Admitting: Emergency Medicine

## 2017-07-14 ENCOUNTER — Encounter: Payer: Self-pay | Admitting: Emergency Medicine

## 2017-07-14 VITALS — BP 130/64 | HR 60 | Temp 98.0°F | Resp 16 | Ht 68.0 in | Wt 140.0 lb

## 2017-07-14 DIAGNOSIS — I10 Essential (primary) hypertension: Secondary | ICD-10-CM | POA: Diagnosis not present

## 2017-07-14 DIAGNOSIS — T50905A Adverse effect of unspecified drugs, medicaments and biological substances, initial encounter: Secondary | ICD-10-CM

## 2017-07-14 DIAGNOSIS — L298 Other pruritus: Secondary | ICD-10-CM

## 2017-07-14 MED ORDER — HYDROCHLOROTHIAZIDE 25 MG PO TABS: 25.0000 mg | ORAL_TABLET | Freq: Every day | ORAL | 3 refills | Status: DC

## 2017-07-14 MED ORDER — AMLODIPINE BESYLATE 5 MG PO TABS: 5.0000 mg | ORAL_TABLET | Freq: Every day | ORAL | 3 refills | Status: DC

## 2017-07-14 NOTE — Progress Notes (Signed)
Brian Willis 71 y.o.   Chief Complaint  Patient presents with  . Follow-up    per patient blood pressure medication  . Urticaria    per patient from BP medication - all over from head toe    HISTORY OF PRESENT ILLNESS: This is a 71 y.o. male complaining of general itching that started after increasing Norvasc from 5 to 10 mg. No angioedema or respiratory compromise. No other significant symptoms.  Urticaria  This is a new problem. The current episode started 1 to 4 weeks ago. The problem has been waxing and waning since onset. The affected locations include the left hand and right hand. The rash is characterized by itchiness. He was exposed to a new medication (increased dose). Pertinent negatives include no congestion, cough, diarrhea, facial edema, fatigue, fever, joint pain, shortness of breath, sore throat or vomiting. Past treatments include nothing.     Prior to Admission medications   Medication Sig Start Date End Date Taking? Authorizing Provider  amLODipine (NORVASC) 10 MG tablet Take 1 tablet (10 mg total) by mouth daily. 06/24/17  Yes Elexus Barman, Eilleen Kempf, MD  lisinopril-hydrochlorothiazide (PRINZIDE,ZESTORETIC) 20-25 MG tablet Take 1 tablet by mouth daily. 12/17/16  Yes Georgian Co M, PA-C  ibuprofen (ADVIL,MOTRIN) 400 MG tablet Take 1 tablet (400 mg total) by mouth every 6 (six) hours as needed. Patient not taking: Reported on 12/17/2016 12/11/16   Maxie Barb, MD    No Active Allergies  Patient Active Problem List   Diagnosis Date Noted  . Syncope 12/09/2016  . Influenza-like illness 12/09/2016  . Cervical disc disorder with radiculopathy of cervical region 03/17/2016  . 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 Graves disease 07/26/2013  . BPH (benign prostatic hyperplasia) 07/26/2013  . Essential hypertension     Past Medical History:  Diagnosis Date  . HTN (hypertension)   . Hyperlipidemia   . Thyroid disease     Past Surgical History:    Procedure Laterality Date  . APPENDECTOMY      Social History   Social History  . Marital status: Single    Spouse name: N/A  . Number of children: N/A  . Years of education: N/A   Occupational History  . Not on file.   Social History Main Topics  . Smoking status: Never Smoker  . Smokeless tobacco: Never Used  . Alcohol use No  . Drug use: No  . Sexual activity: Not on file   Other Topics Concern  . Not on file   Social History Narrative  . No narrative on file    Family History  Problem Relation Age of Onset  . Hypertension Mother   . Hypertension Father      Review of Systems  Constitutional: Negative for chills, fatigue, fever and weight loss.  HENT: Negative.  Negative for congestion, nosebleeds and sore throat.   Eyes: Negative.  Negative for blurred vision and double vision.  Respiratory: Negative for cough, hemoptysis, shortness of breath and wheezing.   Cardiovascular: Negative for chest pain, palpitations and leg swelling.  Gastrointestinal: Negative for abdominal pain, blood in stool, diarrhea, nausea and vomiting.  Genitourinary: Negative.  Negative for dysuria and hematuria.  Musculoskeletal: Negative for joint pain, myalgias and neck pain.  Skin: Positive for itching. Negative for rash.  Neurological: Negative.  Negative for dizziness and headaches.  Endo/Heme/Allergies: Negative.   All other systems reviewed and are negative.   Vitals:   07/14/17 1026 07/14/17 1031  BP: (!) 159/73 130/64  Pulse:  60   Resp: 16   Temp: 98 F (36.7 C)   SpO2: 99%     Physical Exam  Constitutional: He is oriented to person, place, and time. He appears well-developed and well-nourished.  HENT:  Head: Normocephalic and atraumatic.  Nose: Nose normal.  Mouth/Throat: Oropharynx is clear and moist. No oropharyngeal exudate.  Eyes: Pupils are equal, round, and reactive to light. Conjunctivae and EOM are normal.  Neck: Normal range of motion. Neck supple. No  JVD present. No thyromegaly present.  Cardiovascular: Normal rate, regular rhythm, normal heart sounds and intact distal pulses.   Pulmonary/Chest: Effort normal and breath sounds normal.  Abdominal: Soft. Bowel sounds are normal. There is no tenderness.  Musculoskeletal: Normal range of motion.  Lymphadenopathy:    He has no cervical adenopathy.  Neurological: He is alert and oriented to person, place, and time. No sensory deficit. He exhibits normal muscle tone.  Skin: Skin is warm and dry. Capillary refill takes less than 2 seconds. No rash noted.  Psychiatric: He has a normal mood and affect. His behavior is normal.  Vitals reviewed.    ASSESSMENT & PLAN: Hendry was seen today for follow-up and urticaria.  Diagnoses and all orders for this visit:  Itching due to drug  Essential hypertension, benign  Other orders -     hydrochlorothiazide (HYDRODIURIL) 25 MG tablet; Take 1 tablet (25 mg total) by mouth daily. -     amLODipine (NORVASC) 5 MG tablet; Take 1 tablet (5 mg total) by mouth daily.  Follow up in 3 months.  Patient Instructions     Decrease Norvasc to 5 mg daily and start HCTZ 25 mg daily.  Hypertension Hypertension is another name for high blood pressure. High blood pressure forces your heart to work harder to pump blood. This can cause problems over time. There are two numbers in a blood pressure reading. There is a top number (systolic) over a bottom number (diastolic). It is best to have a blood pressure below 120/80. Healthy choices can help lower your blood pressure. You may need medicine to help lower your blood pressure if:  Your blood pressure cannot be lowered with healthy choices.  Your blood pressure is higher than 130/80.  Follow these instructions at home: Eating and drinking  If directed, follow the DASH eating plan. This diet includes: ? Filling half of your plate at each meal with fruits and vegetables. ? Filling one quarter of your plate at  each meal with whole grains. Whole grains include whole wheat pasta, brown rice, and whole grain bread. ? Eating or drinking low-fat dairy products, such as skim milk or low-fat yogurt. ? Filling one quarter of your plate at each meal with low-fat (lean) proteins. Low-fat proteins include fish, skinless chicken, eggs, beans, and tofu. ? Avoiding fatty meat, cured and processed meat, or chicken with skin. ? Avoiding premade or processed food.  Eat less than 1,500 mg of salt (sodium) a day.  Limit alcohol use to no more than 1 drink a day for nonpregnant women and 2 drinks a day for men. One drink equals 12 oz of beer, 5 oz of wine, or 1 oz of hard liquor. Lifestyle  Work with your doctor to stay at a healthy weight or to lose weight. Ask your doctor what the best weight is for you.  Get at least 30 minutes of exercise that causes your heart to beat faster (aerobic exercise) most days of the week. This may include walking, swimming,  or biking.  Get at least 30 minutes of exercise that strengthens your muscles (resistance exercise) at least 3 days a week. This may include lifting weights or pilates.  Do not use any products that contain nicotine or tobacco. This includes cigarettes and e-cigarettes. If you need help quitting, ask your doctor.  Check your blood pressure at home as told by your doctor.  Keep all follow-up visits as told by your doctor. This is important. Medicines  Take over-the-counter and prescription medicines only as told by your doctor. Follow directions carefully.  Do not skip doses of blood pressure medicine. The medicine does not work as well if you skip doses. Skipping doses also puts you at risk for problems.  Ask your doctor about side effects or reactions to medicines that you should watch for. Contact a doctor if:  You think you are having a reaction to the medicine you are taking.  You have headaches that keep coming back (recurring).  You feel  dizzy.  You have swelling in your ankles.  You have trouble with your vision. Get help right away if:  You get a very bad headache.  You start to feel confused.  You feel weak or numb.  You feel faint.  You get very bad pain in your: ? Chest. ? Belly (abdomen).  You throw up (vomit) more than once.  You have trouble breathing. Summary  Hypertension is another name for high blood pressure.  Making healthy choices can help lower blood pressure. If your blood pressure cannot be controlled with healthy choices, you may need to take medicine. This information is not intended to replace advice given to you by your health care provider. Make sure you discuss any questions you have with your health care provider. Document Released: 04/13/2008 Document Revised: 09/23/2016 Document Reviewed: 09/23/2016 Elsevier Interactive Patient Education  2018 [employer]   IF you received an x-ray today, you will receive an invoice from [facility]. Please contact [facility] at [phone] with questions or concerns regarding your invoice.   IF you received labwork today, you will receive an invoice from [facility]. Please contact [facility] at [phone] with questions or concerns regarding your invoice.   Our billing staff will not be able to assist you with questions regarding bills from these companies.  You will be contacted with the lab results as soon as they are available. The fastest way to get your results is to activate your My Chart account. Instructions are located on the last page of this paperwork. If you have not heard from us regarding the results in 2 weeks, please contact this office.         Edwina BarthMiguel Vivienne Sangiovanni, MD [facility]

## 2017-07-14 NOTE — Patient Instructions (Addendum)
Decrease Norvasc to 5 mg daily and start HCTZ 25 mg daily.  Hypertension Hypertension is another name for high blood pressure. High blood pressure forces your heart to work harder to pump blood. This can cause problems over time. There are two numbers in a blood pressure reading. There is a top number (systolic) over a bottom number (diastolic). It is best to have a blood pressure below 120/80. Healthy choices can help lower your blood pressure. You may need medicine to help lower your blood pressure if:  Your blood pressure cannot be lowered with healthy choices.  Your blood pressure is higher than 130/80.  Follow these instructions at home: Eating and drinking  If directed, follow the DASH eating plan. This diet includes: ? Filling half of your plate at each meal with fruits and vegetables. ? Filling one quarter of your plate at each meal with whole grains. Whole grains include whole wheat pasta, brown rice, and whole grain bread. ? Eating or drinking low-fat dairy products, such as skim milk or low-fat yogurt. ? Filling one quarter of your plate at each meal with low-fat (lean) proteins. Low-fat proteins include fish, skinless chicken, eggs, beans, and tofu. ? Avoiding fatty meat, cured and processed meat, or chicken with skin. ? Avoiding premade or processed food.  Eat less than 1,500 mg of salt (sodium) a day.  Limit alcohol use to no more than 1 drink a day for nonpregnant women and 2 drinks a day for men. One drink equals 12 oz of beer, 5 oz of wine, or 1 oz of hard liquor. Lifestyle  Work with your doctor to stay at a healthy weight or to lose weight. Ask your doctor what the best weight is for you.  Get at least 30 minutes of exercise that causes your heart to beat faster (aerobic exercise) most days of the week. This may include walking, swimming, or biking.  Get at least 30 minutes of exercise that strengthens your muscles (resistance exercise) at least 3 days a week.  This may include lifting weights or pilates.  Do not use any products that contain nicotine or tobacco. This includes cigarettes and e-cigarettes. If you need help quitting, ask your doctor.  Check your blood pressure at home as told by your doctor.  Keep all follow-up visits as told by your doctor. This is important. Medicines  Take over-the-counter and prescription medicines only as told by your doctor. Follow directions carefully.  Do not skip doses of blood pressure medicine. The medicine does not work as well if you skip doses. Skipping doses also puts you at risk for problems.  Ask your doctor about side effects or reactions to medicines that you should watch for. Contact a doctor if:  You think you are having a reaction to the medicine you are taking.  You have headaches that keep coming back (recurring).  You feel dizzy.  You have swelling in your ankles.  You have trouble with your vision. Get help right away if:  You get a very bad headache.  You start to feel confused.  You feel weak or numb.  You feel faint.  You get very bad pain in your: ? Chest. ? Belly (abdomen).  You throw up (vomit) more than once.  You have trouble breathing. Summary  Hypertension is another name for high blood pressure.  Making healthy choices can help lower blood pressure. If your blood pressure cannot be controlled with healthy choices, you may need to take medicine. This  information is not intended to replace advice given to you by your health care provider. Make sure you discuss any questions you have with your health care provider. Document Released: 04/13/2008 Document Revised: 09/23/2016 Document Reviewed: 09/23/2016 Elsevier Interactive Patient Education  2018 [employer].   IF you received an x-ray today, you will receive an invoice from [facility]. Please contact [facility] at [phone] with questions or concerns regarding your invoice.   IF  you received labwork today, you will receive an invoice from [place]. Please contact [facility] at [phone] with questions or concerns regarding your invoice.   Our billing staff will not be able to assist you with questions regarding bills from these companies.  You will be contacted with the lab results as soon as they are available. The fastest way to get your results is to activate your My Chart account. Instructions are located on the last page of this paperwork. If you have not heard from [place] regarding the results in 2 weeks, please contact this office.

## 2017-07-27 ENCOUNTER — Ambulatory Visit: Payer: Medicare Other | Admitting: Family Medicine

## 2017-07-27 DIAGNOSIS — M11262 Other chondrocalcinosis, left knee: Secondary | ICD-10-CM | POA: Diagnosis not present

## 2017-07-27 DIAGNOSIS — M25562 Pain in left knee: Secondary | ICD-10-CM | POA: Diagnosis not present

## 2017-07-27 DIAGNOSIS — M25462 Effusion, left knee: Secondary | ICD-10-CM | POA: Diagnosis not present

## 2017-07-28 ENCOUNTER — Telehealth: Payer: Self-pay | Admitting: Emergency Medicine

## 2017-07-28 NOTE — Telephone Encounter (Signed)
Pt is needing to talk with the provider regarding the leg swelling and pain and which pain medication he would prescribe for him   Best numb [phone]

## 2017-07-30 DIAGNOSIS — M25462 Effusion, left knee: Secondary | ICD-10-CM | POA: Diagnosis not present

## 2017-07-30 NOTE — Telephone Encounter (Signed)
Pt swelling has improved but pain continues. Pt was unable to explain further and stated that he would have to give [place] a call back.

## 2017-08-02 ENCOUNTER — Telehealth: Payer: Self-pay

## 2017-08-02 NOTE — Telephone Encounter (Signed)
Called pt to schedule Medicare Annual Wellness Visit. -nr  

## 2017-09-02 ENCOUNTER — Ambulatory Visit: Payer: Medicare Other | Admitting: Physician Assistant

## 2017-09-02 DIAGNOSIS — Z23 Encounter for immunization: Secondary | ICD-10-CM | POA: Diagnosis not present

## 2017-09-06 DIAGNOSIS — R350 Frequency of micturition: Secondary | ICD-10-CM | POA: Diagnosis not present

## 2017-09-06 DIAGNOSIS — N401 Enlarged prostate with lower urinary tract symptoms: Secondary | ICD-10-CM | POA: Diagnosis not present

## 2017-10-04 IMAGING — CR DG CERVICAL SPINE COMPLETE 4+V
5 series · 5 of 5 positions shown · non-contrast
Comparison: None.

CLINICAL DATA: Neck pain for 4-5 days with no known injury.

EXAM:
CERVICAL SPINE - COMPLETE 4+ VIEW

[lpo]
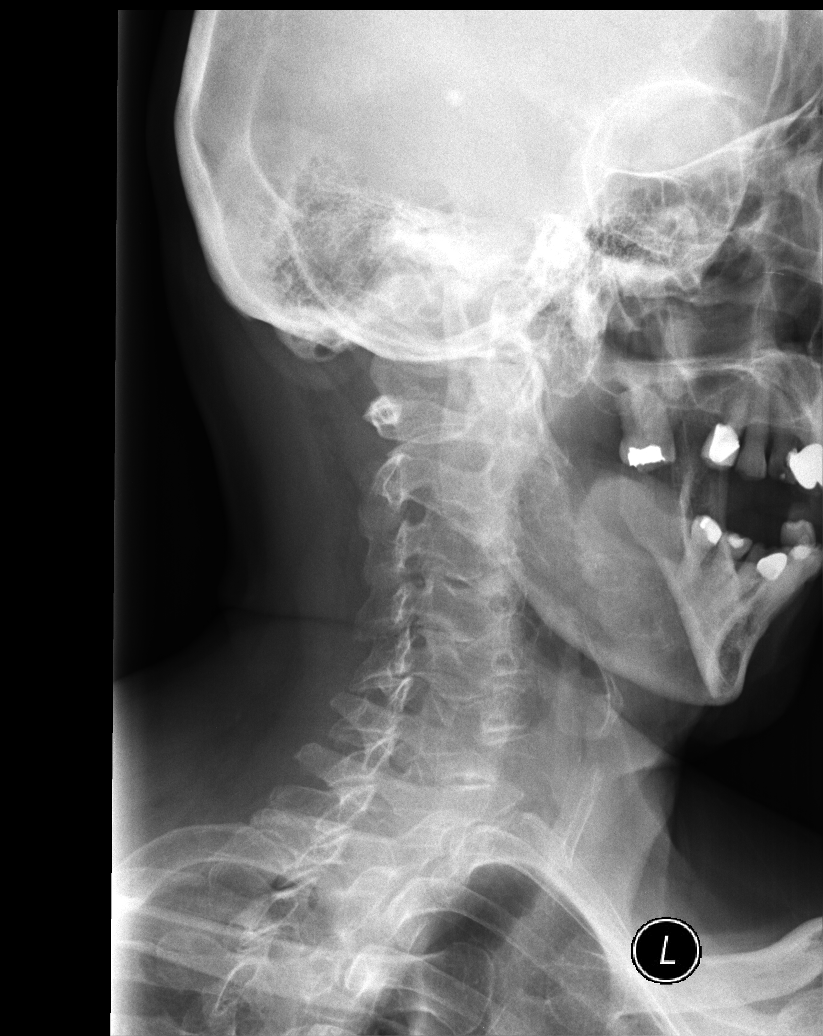

[lateral]
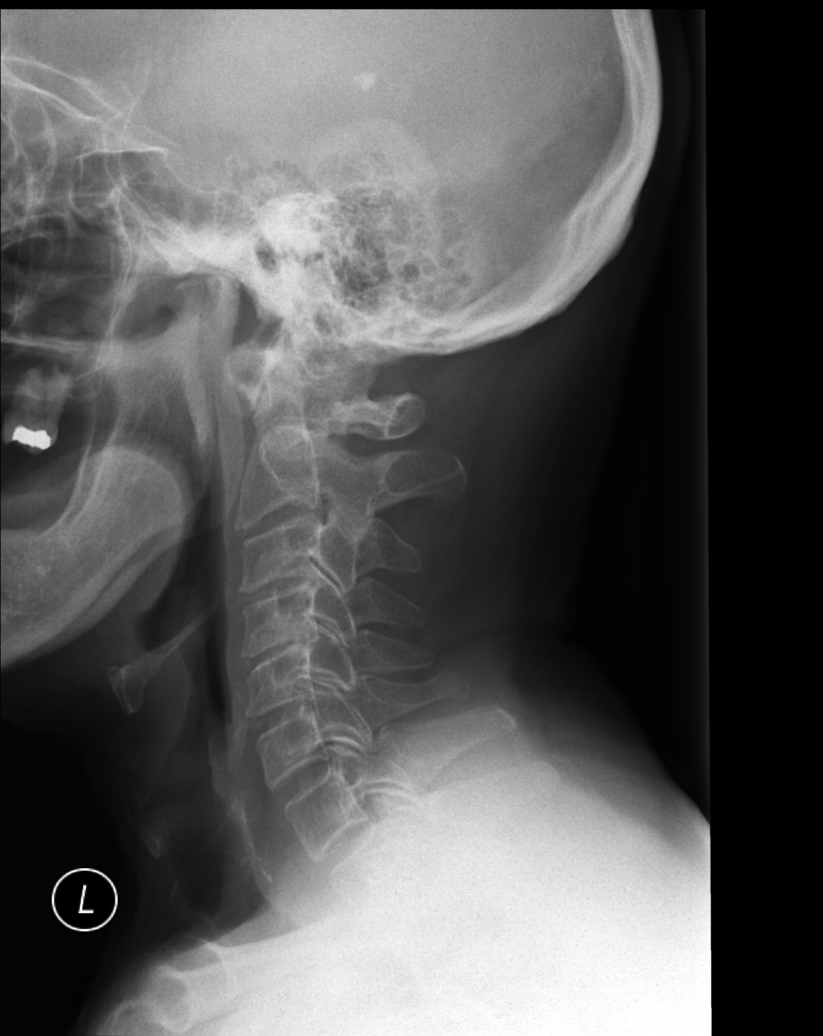

[rpo]
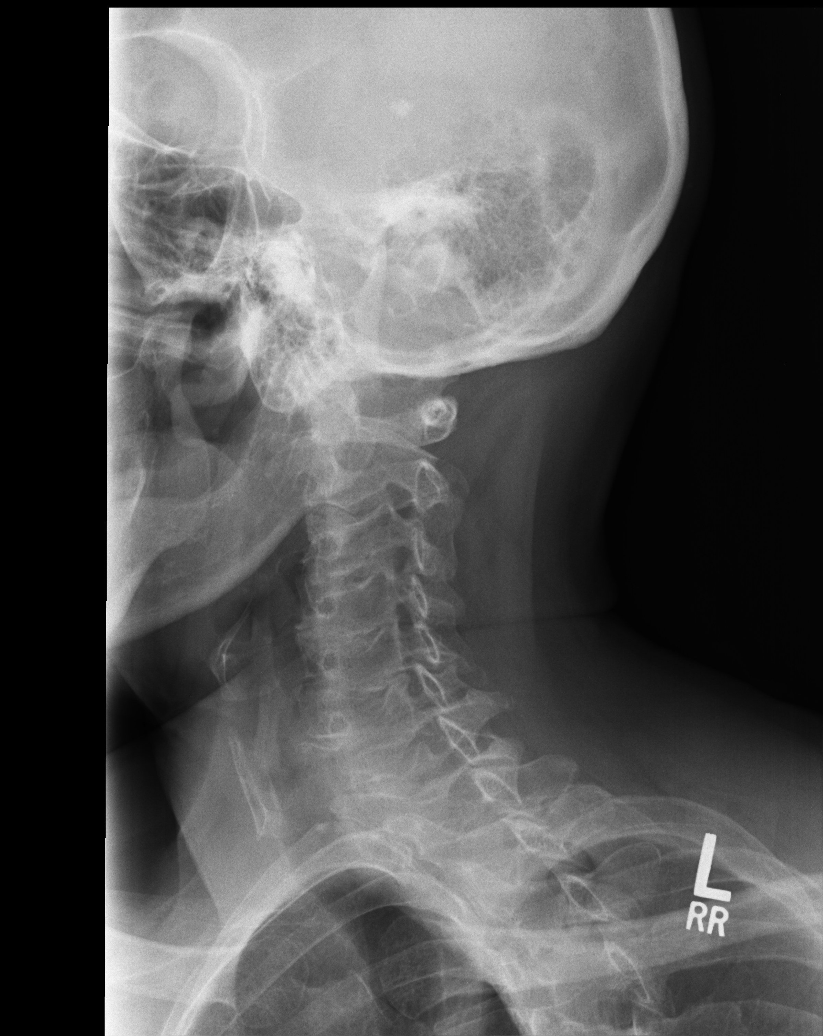

[AP]
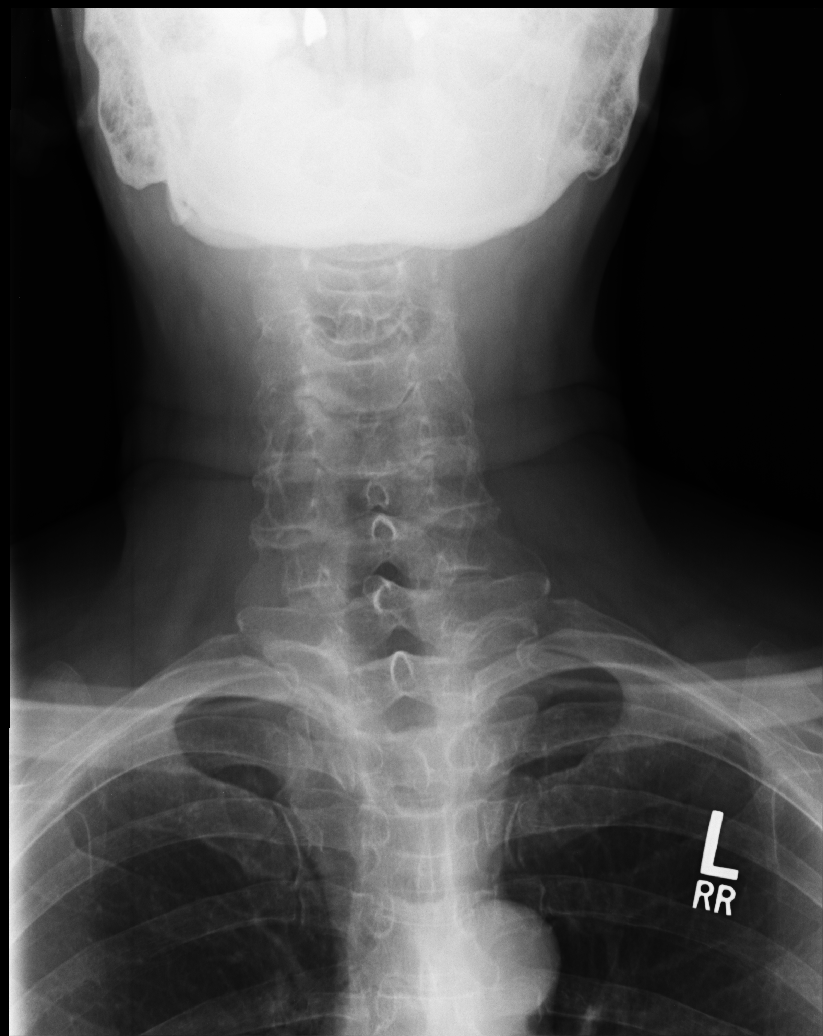

[ap open mouth]
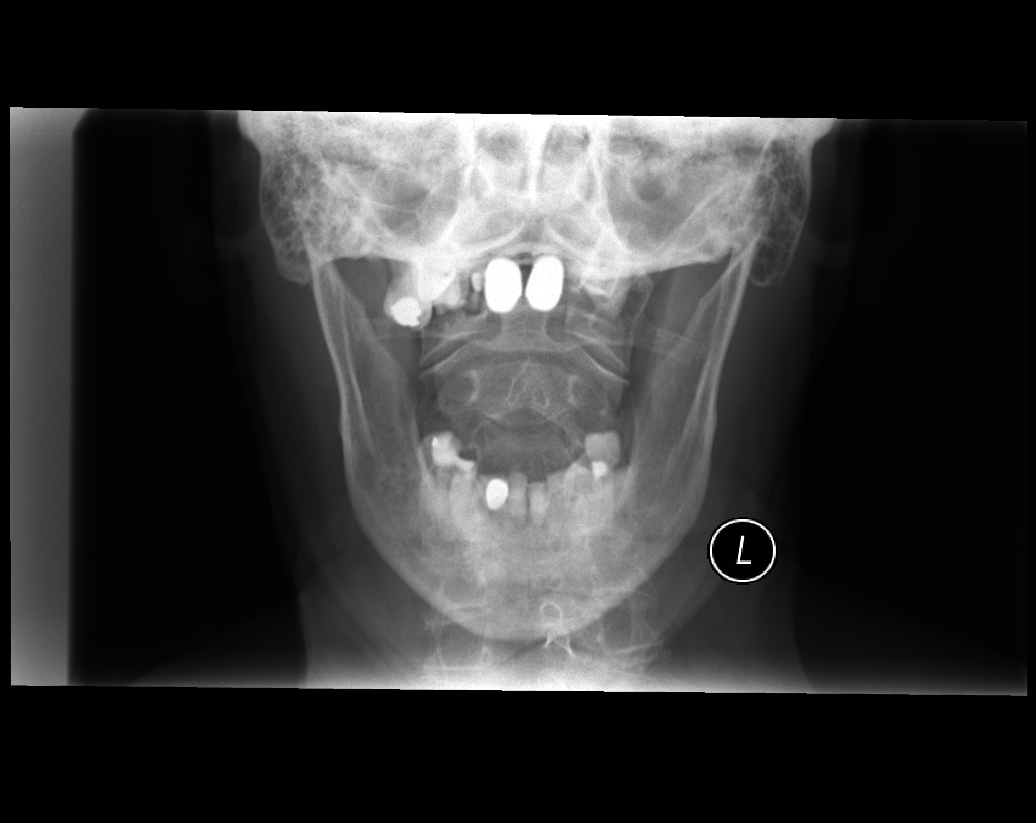

[5 of 5 positions shown; findings below may reference images not displayed]

FINDINGS: No acute findings such as fracture, endplate erosion, or
prevertebral thickening. No evidence of focal bone lesion. There is
degenerative disc narrowing at C3-4 and C4-5, greater at C4-5 where
there is also posterior endplate ridging and right uncovertebral
spurring with foraminal stenosis. Mild left-sided C5-6 uncovertebral
spur without notable foraminal narrowing.
IMPRESSION: C3-4 and C4-5 degenerative disc disease with notable uncovertebral
spurring and foraminal stenosis on the right at C4-5.

## 2018-01-10 DIAGNOSIS — M25462 Effusion, left knee: Secondary | ICD-10-CM | POA: Diagnosis not present

## 2018-01-10 DIAGNOSIS — M11262 Other chondrocalcinosis, left knee: Secondary | ICD-10-CM | POA: Diagnosis not present

## 2018-02-07 ENCOUNTER — Encounter: Payer: Self-pay | Admitting: Physician Assistant

## 2018-04-20 DIAGNOSIS — M11262 Other chondrocalcinosis, left knee: Secondary | ICD-10-CM | POA: Diagnosis not present

## 2018-05-13 DIAGNOSIS — R3912 Poor urinary stream: Secondary | ICD-10-CM | POA: Diagnosis not present

## 2018-05-13 DIAGNOSIS — N400 Enlarged prostate without lower urinary tract symptoms: Secondary | ICD-10-CM | POA: Diagnosis not present

## 2018-05-13 DIAGNOSIS — N401 Enlarged prostate with lower urinary tract symptoms: Secondary | ICD-10-CM | POA: Diagnosis not present

## 2018-06-02 DIAGNOSIS — E0500 Thyrotoxicosis with diffuse goiter without thyrotoxic crisis or storm: Secondary | ICD-10-CM | POA: Diagnosis not present

## 2018-06-06 DIAGNOSIS — E0500 Thyrotoxicosis with diffuse goiter without thyrotoxic crisis or storm: Secondary | ICD-10-CM | POA: Diagnosis not present

## 2018-08-22 ENCOUNTER — Telehealth: Payer: Self-pay | Admitting: Emergency Medicine

## 2018-08-22 NOTE — Telephone Encounter (Unsigned)
Copied from CRM [id number]. Topic: General - Other >> Aug 22, 2018  1:10 PM Burchel, Abbi R wrote: Pt states he works for [employer] on the weekends and is concerned about the recent cases of Mumps they have reported.  He states they are offering vaccines and would like to know if he should get one.  He does not think he was ever vaccinated.  Please call pt to advise.   Pt: [phone]

## 2018-08-22 NOTE — Telephone Encounter (Signed)
Pt advised.

## 2018-08-29 ENCOUNTER — Other Ambulatory Visit: Payer: Self-pay | Admitting: Emergency Medicine

## 2018-08-29 NOTE — Telephone Encounter (Signed)
Courtesy refill; appt with Dr. Alvy Bimler 09/12/18

## 2018-09-12 ENCOUNTER — Encounter: Payer: Self-pay | Admitting: Emergency Medicine

## 2018-09-12 ENCOUNTER — Other Ambulatory Visit: Payer: Self-pay

## 2018-09-12 ENCOUNTER — Ambulatory Visit (INDEPENDENT_AMBULATORY_CARE_PROVIDER_SITE_OTHER): Payer: Medicare Other | Admitting: Emergency Medicine

## 2018-09-12 VITALS — BP 136/63 | HR 76 | Temp 98.1°F | Resp 18 | Ht 68.0 in | Wt 148.0 lb

## 2018-09-12 DIAGNOSIS — H6121 Impacted cerumen, right ear: Secondary | ICD-10-CM | POA: Diagnosis not present

## 2018-09-12 DIAGNOSIS — I10 Essential (primary) hypertension: Secondary | ICD-10-CM

## 2018-09-12 DIAGNOSIS — H9313 Tinnitus, bilateral: Secondary | ICD-10-CM

## 2018-09-12 MED ORDER — AMLODIPINE BESYLATE 10 MG PO TABS: 10.0000 mg | ORAL_TABLET | Freq: Every day | ORAL | 3 refills | Status: DC

## 2018-09-12 MED ORDER — HYDROCHLOROTHIAZIDE 25 MG PO TABS: ORAL_TABLET | ORAL | 3 refills | Status: AC

## 2018-09-12 NOTE — Progress Notes (Signed)
Brian Willis 72 y.o.   Chief Complaint  Patient presents with  . Medication Refill    all meds   . Ear Pain    ringing in ears     HISTORY OF PRESENT ILLNESS: This is a 72 y.o. male with history of hypertension here for follow-up and medication refill.  Also complaining of ringing in the ears, right more than.  May have earwax buildup.  No other significant symptoms.  HPI   Prior to Admission medications   Medication Sig Start Date End Date Taking? Authorizing Provider  amLODipine (NORVASC) 5 MG tablet Take 1 tablet (5 mg total) by mouth daily. 07/14/17  Yes Nomi Rudnicki, Eilleen Kempf, MD  hydrochlorothiazide (HYDRODIURIL) 25 MG tablet TAKE 1 TABLET(25 MG) BY MOUTH DAILY 08/29/18  Yes Jatasia Gundrum, Eilleen Kempf, MD  ibuprofen (ADVIL,MOTRIN) 400 MG tablet Take 1 tablet (400 mg total) by mouth every 6 (six) hours as needed. 12/11/16  Yes Maxie Barb, MD  lisinopril-hydrochlorothiazide (PRINZIDE,ZESTORETIC) 20-25 MG tablet Take 1 tablet by mouth daily. 12/17/16  Yes Anders Simmonds, PA-C    No Active Allergies  Patient Active Problem List   Diagnosis Date Noted  . Itching due to drug 07/14/2017  . Syncope 12/09/2016  . Influenza-like illness 12/09/2016  . Cervical disc disorder with radiculopathy of cervical region 03/17/2016  . 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 Graves disease 07/26/2013  . BPH (benign prostatic hyperplasia) 07/26/2013  . Essential hypertension, benign     Past Medical History:  Diagnosis Date  . HTN (hypertension)   . Hyperlipidemia   . Thyroid disease     Past Surgical History:  Procedure Laterality Date  . APPENDECTOMY      Social History   Socioeconomic History  . Marital status: Single    Spouse name: Not on file  . Number of children: Not on file  . Years of education: Not on file  . Highest education level: Not on file  Occupational History  . Not on file  Social Needs  . Financial resource strain: Not on file  . Food  insecurity:    Worry: Not on file    Inability: Not on file  . Transportation needs:    Medical: Not on file    Non-medical: Not on file  Tobacco Use  . Smoking status: Never Smoker  . Smokeless tobacco: Never Used  Substance and Sexual Activity  . Alcohol use: No  . Drug use: No  . Sexual activity: Not on file  Lifestyle  . Physical activity:    Days per week: Not on file    Minutes per session: Not on file  . Stress: Not on file  Relationships  . Social connections:    Talks on phone: Not on file    Gets together: Not on file    Attends religious service: Not on file    Active member of club or organization: Not on file    Attends meetings of clubs or organizations: Not on file    Relationship status: Not on file  . Intimate partner violence:    Fear of current or ex partner: Not on file    Emotionally abused: Not on file    Physically abused: Not on file    Forced sexual activity: Not on file  Other Topics Concern  . Not on file  Social History Narrative  . Not on file    Family History  Problem Relation Age of Onset  . Hypertension Mother   .  Hypertension Father      Review of Systems  Constitutional: Negative.  Negative for chills and fever.  HENT: Positive for hearing loss and tinnitus. Negative for congestion, nosebleeds and sore throat.   Eyes: Negative.  Negative for blurred vision and double vision.  Respiratory: Negative.  Negative for cough and shortness of breath.   Cardiovascular: Negative.  Negative for chest pain and palpitations.  Gastrointestinal: Negative.  Negative for abdominal pain, diarrhea, nausea and vomiting.  Genitourinary: Negative.  Negative for dysuria and hematuria.  Musculoskeletal: Positive for joint pain (Left knee pain). Negative for myalgias and neck pain.  Skin: Negative.  Negative for rash.  Neurological: Negative.  Negative for dizziness.  Endo/Heme/Allergies: Negative.     Vitals:   09/12/18 1429  BP: 136/63  Pulse:  76  Resp: 18  Temp: 98.1 F (36.7 C)  SpO2: 97%    Physical Exam  Constitutional: He is oriented to person, place, and time. He appears well-developed and well-nourished.  HENT:  Head: Normocephalic and atraumatic.  Right Ear: External ear normal.  Left Ear: External ear normal.  Nose: Nose normal.  Mouth/Throat: Oropharynx is clear and moist.  Right ear: Ceruminosis  Eyes: Pupils are equal, round, and reactive to light. Conjunctivae and EOM are normal.  Neck: Normal range of motion. Neck supple.  Cardiovascular: Normal rate, regular rhythm and normal heart sounds.  Pulmonary/Chest: Effort normal.  Abdominal: Soft. He exhibits no distension. There is no tenderness.  Musculoskeletal: Normal range of motion.  Neurological: He is alert and oriented to person, place, and time. No sensory deficit. He exhibits normal muscle tone.  Skin: Skin is warm and dry. Capillary refill takes less than 2 seconds.  Psychiatric: He has a normal mood and affect. His behavior is normal.  Vitals reviewed.    ASSESSMENT & PLAN: [name] was seen today for medication refill and ear pain.  Diagnoses and all orders for this visit:  Essential hypertension, benign -     hydrochlorothiazide (HYDRODIURIL) 25 MG tablet; TAKE 1 TABLET(25 MG) BY MOUTH DAILY -     amLODipine (NORVASC) 10 MG tablet; Take 1 tablet (10 mg total) by mouth daily.  Tinnitus of both ears  Ceruminosis, right    Patient Instructions       If you have lab work done today you will be contacted with your lab results within the next 2 weeks.  If you have not heard from [place] then please contact us. The fastest way to get your results is to register for My Chart.   IF you received an x-ray today, you will receive an invoice from [facility]. Please contact [facility] at [phone] with questions or concerns regarding your invoice.   IF you received labwork today, you will receive an invoice from [facility]. Please  contact [facility] at [phone] with questions or concerns regarding your invoice.   Our billing staff will not be able to assist you with questions regarding bills from these companies.  You will be contacted with the lab results as soon as they are available. The fastest way to get your results is to activate your My Chart account. Instructions are located on the last page of this paperwork. If you have not heard from [place] regarding the results in 2 weeks, please contact this office.     Hypertension Hypertension, commonly called high blood pressure, is when the force of blood pumping through the arteries is too strong. The arteries are the blood vessels that carry blood from  the heart throughout the body. Hypertension forces the heart to work harder to pump blood and may cause arteries to become narrow or stiff. Having untreated or uncontrolled hypertension can cause heart attacks, strokes, kidney disease, and other problems. A blood pressure reading consists of a higher number over a lower number. Ideally, your blood pressure should be below 120/80. The first ("top") number is called the systolic pressure. It is a measure of the pressure in your arteries as your heart beats. The second ("bottom") number is called the diastolic pressure. It is a measure of the pressure in your arteries as the heart relaxes. What are the causes? The cause of this condition is not known. What increases the risk? Some risk factors for high blood pressure are under your control. Others are not. Factors you can change  Smoking.  Having type 2 diabetes mellitus, high cholesterol, or both.  Not getting enough exercise or physical activity.  Being overweight.  Having too much fat, sugar, calories, or salt (sodium) in your diet.  Drinking too much alcohol. Factors that are difficult or impossible to change  Having chronic kidney disease.  Having a family history of high blood pressure.  Age. Risk  increases with age.  Race. You may be at higher risk if you are African-American.  Gender. Men are at higher risk than women before age 58. After age 51, women are at higher risk than men.  Having obstructive sleep apnea.  Stress. What are the signs or symptoms? Extremely high blood pressure (hypertensive crisis) may cause:  Headache.  Anxiety.  Shortness of breath.  Nosebleed.  Nausea and vomiting.  Severe chest pain.  Jerky movements you cannot control (seizures).  How is this diagnosed? This condition is diagnosed by measuring your blood pressure while you are seated, with your arm resting on a surface. The cuff of the blood pressure monitor will be placed directly against the skin of your upper arm at the level of your heart. It should be measured at least twice using the same arm. Certain conditions can cause a difference in blood pressure between your right and left arms. Certain factors can cause blood pressure readings to be lower or higher than normal (elevated) for a short period of time:  When your blood pressure is higher when you are in a health care provider's office than when you are at home, this is called white coat hypertension. Most people with this condition do not need medicines.  When your blood pressure is higher at home than when you are in a health care provider's office, this is called masked hypertension. Most people with this condition may need medicines to control blood pressure.  If you have a high blood pressure reading during one visit or you have normal blood pressure with other risk factors:  You may be asked to return on a different day to have your blood pressure checked again.  You may be asked to monitor your blood pressure at home for 1 week or longer.  If you are diagnosed with hypertension, you may have other blood or imaging tests to help your health care provider understand your overall risk for other conditions. How is this  treated? This condition is treated by making healthy lifestyle changes, such as eating healthy foods, exercising more, and reducing your alcohol intake. Your health care provider may prescribe medicine if lifestyle changes are not enough to get your blood pressure under control, and if:  Your systolic blood pressure is above 130.  Your diastolic blood pressure is above 80.  Your personal target blood pressure may vary depending on your medical conditions, your age, and other factors. Follow these instructions at home: Eating and drinking  Eat a diet that is high in fiber and potassium, and low in sodium, added sugar, and fat. An example eating plan is called the DASH (Dietary Approaches to Stop Hypertension) diet. To eat this way: ? Eat plenty of fresh fruits and vegetables. Try to fill half of your plate at each meal with fruits and vegetables. ? Eat whole grains, such as whole wheat pasta, brown rice, or whole grain bread. Fill about one quarter of your plate with whole grains. ? Eat or drink low-fat dairy products, such as skim milk or low-fat yogurt. ? Avoid fatty cuts of meat, processed or cured meats, and poultry with skin. Fill about one quarter of your plate with lean proteins, such as fish, chicken without skin, beans, eggs, and tofu. ? Avoid premade and processed foods. These tend to be higher in sodium, added sugar, and fat.  Reduce your daily sodium intake. Most people with hypertension should eat less than 1,500 mg of sodium a day.  Limit alcohol intake to no more than 1 drink a day for nonpregnant women and 2 drinks a day for men. One drink equals 12 oz of beer, 5 oz of wine, or 1 oz of hard liquor. Lifestyle  Work with your health care provider to maintain a healthy body weight or to lose weight. Ask what an ideal weight is for you.  Get at least 30 minutes of exercise that causes your heart to beat faster (aerobic exercise) most days of the week. Activities may include  walking, swimming, or biking.  Include exercise to strengthen your muscles (resistance exercise), such as pilates or lifting weights, as part of your weekly exercise routine. Try to do these types of exercises for 30 minutes at least 3 days a week.  Do not use any products that contain nicotine or tobacco, such as cigarettes and e-cigarettes. If you need help quitting, ask your health care provider.  Monitor your blood pressure at home as told by your health care provider.  Keep all follow-up visits as told by your health care provider. This is important. Medicines  Take over-the-counter and prescription medicines only as told by your health care provider. Follow directions carefully. Blood pressure medicines must be taken as prescribed.  Do not skip doses of blood pressure medicine. Doing this puts you at risk for problems and can make the medicine less effective.  Ask your health care provider about side effects or reactions to medicines that you should watch for. Contact a health care provider if:  You think you are having a reaction to a medicine you are taking.  You have headaches that keep coming back (recurring).  You feel dizzy.  You have swelling in your ankles.  You have trouble with your vision. Get help right away if:  You develop a severe headache or confusion.  You have unusual weakness or numbness.  You feel faint.  You have severe pain in your chest or abdomen.  You vomit repeatedly.  You have trouble breathing. Summary  Hypertension is when the force of blood pumping through your arteries is too strong. If this condition is not controlled, it may put you at risk for serious complications.  Your personal target blood pressure may vary depending on your medical conditions, your age, and other factors. For most people,  a normal blood pressure is less than 120/80.  Hypertension is treated with lifestyle changes, medicines, or a combination of both. Lifestyle  changes include weight loss, eating a healthy, low-sodium diet, exercising more, and limiting alcohol. This information is not intended to replace advice given to you by your health care provider. Make sure you discuss any questions you have with your health care provider. Document Released: 10/26/2005 Document Revised: 09/23/2016 Document Reviewed: 09/23/2016 Elsevier Interactive Patient Education  2018 [employer].  Earwax Buildup, Adult The ears produce a substance called earwax that helps keep bacteria out of the ear and protects the skin in the ear canal. Occasionally, earwax can build up in the ear and cause discomfort or hearing loss. What increases the risk? This condition is more likely to develop in people who:  Are male.  Are elderly.  Naturally produce more earwax.  Clean their ears often with cotton swabs.  Use earplugs often.  Use in-ear headphones often.  Wear hearing aids.  Have narrow ear canals.  Have earwax that is overly thick or sticky.  Have eczema.  Are dehydrated.  Have excess hair in the ear canal.  What are the signs or symptoms? Symptoms of this condition include:  Reduced or muffled hearing.  A feeling of fullness in the ear or feeling that the ear is plugged.  Fluid coming from the ear.  Ear pain.  Ear itch.  Ringing in the ear.  Coughing.  An obvious piece of earwax that can be seen inside the ear canal.  How is this diagnosed? This condition may be diagnosed based on:  Your symptoms.  Your medical history.  An ear exam. During the exam, your health care provider will look into your ear with an instrument called an otoscope.  You may have tests, including a hearing test. How is this treated? This condition may be treated by:  Using ear drops to soften the earwax.  Having the earwax removed by a health care provider. The health care provider may: ? Flush the ear with water. ? Use an instrument that has a loop on the  end (curette). ? Use a suction device.  Surgery to remove the wax buildup. This may be done in severe cases.  Follow these instructions at home:  Take over-the-counter and prescription medicines only as told by your health care provider.  Do not put any objects, including cotton swabs, into your ear. You can clean the opening of your ear canal with a washcloth or facial tissue.  Follow instructions from your health care provider about cleaning your ears. Do not over-clean your ears.  Drink enough fluid to keep your urine clear or pale yellow. This will help to thin the earwax.  Keep all follow-up visits as told by your health care provider. If earwax builds up in your ears often or if you use hearing aids, consider seeing your health care provider for routine, preventive ear cleanings. Ask your health care provider how often you should schedule your cleanings.  If you have hearing aids, clean them according to instructions from the manufacturer and your health care provider. Contact a health care provider if:  You have ear pain.  You develop a fever.  You have blood, pus, or other fluid coming from your ear.  You have hearing loss.  You have ringing in your ears that does not go away.  Your symptoms do not improve with treatment.  You feel like the room is spinning (vertigo). Summary  Earwax  can build up in the ear and cause discomfort or hearing loss.  The most common symptoms of this condition include reduced or muffled hearing and a feeling of fullness in the ear or feeling that the ear is plugged.  This condition may be diagnosed based on your symptoms, your medical history, and an ear exam.  This condition may be treated by using ear drops to soften the earwax or by having the earwax removed by a health care provider.  Do not put any objects, including cotton swabs, into your ear. You can clean the opening of your ear canal with a washcloth or facial tissue. This  information is not intended to replace advice given to you by your health care provider. Make sure you discuss any questions you have with your health care provider. Document Released: 12/03/2004 Document Revised: 01/06/2017 Document Reviewed: 01/06/2017 Elsevier Interactive Patient Education  2018 [employer]      Edwina Barth, MD [facility]

## 2018-09-12 NOTE — Patient Instructions (Addendum)
   If you have lab work done today you will be contacted with your lab results within the next 2 weeks.  If you have not heard from us then please contact us. The fastest way to get your results is to register for My Chart.   IF you received an x-ray today, you will receive an invoice from [facility]. Please contact [facility] at [phone] with questions or concerns regarding your invoice.   IF you received labwork today, you will receive an invoice from [facility]. Please contact [facility] at [phone] with questions or concerns regarding your invoice.   Our billing staff will not be able to assist you with questions regarding bills from these companies.  You will be contacted with the lab results as soon as they are available. The fastest way to get your results is to activate your My Chart account. Instructions are located on the last page of this paperwork. If you have not heard from us regarding the results in 2 weeks, please contact this office.     Hypertension Hypertension, commonly called high blood pressure, is when the force of blood pumping through the arteries is too strong. The arteries are the blood vessels that carry blood from the heart throughout the body. Hypertension forces the heart to work harder to pump blood and may cause arteries to become narrow or stiff. Having untreated or uncontrolled hypertension can cause heart attacks, strokes, kidney disease, and other problems. A blood pressure reading consists of a higher number over a lower number. Ideally, your blood pressure should be below 120/80. The first ("top") number is called the systolic pressure. It is a measure of the pressure in your arteries as your heart beats. The second ("bottom") number is called the diastolic pressure. It is a measure of the pressure in your arteries as the heart relaxes. What are the causes? The cause of this condition is not known. What increases the risk? Some  risk factors for high blood pressure are under your control. Others are not. Factors you can change  Smoking.  Having type 2 diabetes mellitus, high cholesterol, or both.  Not getting enough exercise or physical activity.  Being overweight.  Having too much fat, sugar, calories, or salt (sodium) in your diet.  Drinking too much alcohol. Factors that are difficult or impossible to change  Having chronic kidney disease.  Having a family history of high blood pressure.  Age. Risk increases with age.  Race. You may be at higher risk if you are African-American.  Gender. Men are at higher risk than women before age 45. After age 65, women are at higher risk than men.  Having obstructive sleep apnea.  Stress. What are the signs or symptoms? Extremely high blood pressure (hypertensive crisis) may cause:  Headache.  Anxiety.  Shortness of breath.  Nosebleed.  Nausea and vomiting.  Severe chest pain.  Jerky movements you cannot control (seizures).  How is this diagnosed? This condition is diagnosed by measuring your blood pressure while you are seated, with your arm resting on a surface. The cuff of the blood pressure monitor will be placed directly against the skin of your upper arm at the level of your heart. It should be measured at least twice using the same arm. Certain conditions can cause a difference in blood pressure between your right and left arms. Certain factors can cause blood pressure readings to be lower or higher than normal (elevated) for a short period of time:  When   your blood pressure is higher when you are in a health care provider's office than when you are at home, this is called white coat hypertension. Most people with this condition do not need medicines.  When your blood pressure is higher at home than when you are in a health care provider's office, this is called masked hypertension. Most people with this condition may need medicines to control  blood pressure.  If you have a high blood pressure reading during one visit or you have normal blood pressure with other risk factors:  You may be asked to return on a different day to have your blood pressure checked again.  You may be asked to monitor your blood pressure at home for 1 week or longer.  If you are diagnosed with hypertension, you may have other blood or imaging tests to help your health care provider understand your overall risk for other conditions. How is this treated? This condition is treated by making healthy lifestyle changes, such as eating healthy foods, exercising more, and reducing your alcohol intake. Your health care provider may prescribe medicine if lifestyle changes are not enough to get your blood pressure under control, and if:  Your systolic blood pressure is above 130.  Your diastolic blood pressure is above 80.  Your personal target blood pressure may vary depending on your medical conditions, your age, and other factors. Follow these instructions at home: Eating and drinking  Eat a diet that is high in fiber and potassium, and low in sodium, added sugar, and fat. An example eating plan is called the DASH (Dietary Approaches to Stop Hypertension) diet. To eat this way: ? Eat plenty of fresh fruits and vegetables. Try to fill half of your plate at each meal with fruits and vegetables. ? Eat whole grains, such as whole wheat pasta, brown rice, or whole grain bread. Fill about one quarter of your plate with whole grains. ? Eat or drink low-fat dairy products, such as skim milk or low-fat yogurt. ? Avoid fatty cuts of meat, processed or cured meats, and poultry with skin. Fill about one quarter of your plate with lean proteins, such as fish, chicken without skin, beans, eggs, and tofu. ? Avoid premade and processed foods. These tend to be higher in sodium, added sugar, and fat.  Reduce your daily sodium intake. Most people with hypertension should eat less  than 1,500 mg of sodium a day.  Limit alcohol intake to no more than 1 drink a day for nonpregnant women and 2 drinks a day for men. One drink equals 12 oz of beer, 5 oz of wine, or 1 oz of hard liquor. Lifestyle  Work with your health care provider to maintain a healthy body weight or to lose weight. Ask what an ideal weight is for you.  Get at least 30 minutes of exercise that causes your heart to beat faster (aerobic exercise) most days of the week. Activities may include walking, swimming, or biking.  Include exercise to strengthen your muscles (resistance exercise), such as pilates or lifting weights, as part of your weekly exercise routine. Try to do these types of exercises for 30 minutes at least 3 days a week.  Do not use any products that contain nicotine or tobacco, such as cigarettes and e-cigarettes. If you need help quitting, ask your health care provider.  Monitor your blood pressure at home as told by your health care provider.  Keep all follow-up visits as told by your health care provider.   This is important. Medicines  Take over-the-counter and prescription medicines only as told by your health care provider. Follow directions carefully. Blood pressure medicines must be taken as prescribed.  Do not skip doses of blood pressure medicine. Doing this puts you at risk for problems and can make the medicine less effective.  Ask your health care provider about side effects or reactions to medicines that you should watch for. Contact a health care provider if:  You think you are having a reaction to a medicine you are taking.  You have headaches that keep coming back (recurring).  You feel dizzy.  You have swelling in your ankles.  You have trouble with your vision. Get help right away if:  You develop a severe headache or confusion.  You have unusual weakness or numbness.  You feel faint.  You have severe pain in your chest or abdomen.  You vomit  repeatedly.  You have trouble breathing. Summary  Hypertension is when the force of blood pumping through your arteries is too strong. If this condition is not controlled, it may put you at risk for serious complications.  Your personal target blood pressure may vary depending on your medical conditions, your age, and other factors. For most people, a normal blood pressure is less than 120/80.  Hypertension is treated with lifestyle changes, medicines, or a combination of both. Lifestyle changes include weight loss, eating a healthy, low-sodium diet, exercising more, and limiting alcohol. This information is not intended to replace advice given to you by your health care provider. Make sure you discuss any questions you have with your health care provider. Document Released: 10/26/2005 Document Revised: 09/23/2016 Document Reviewed: 09/23/2016 Elsevier Interactive Patient Education  2018 [employer].  Earwax Buildup, Adult The ears produce a substance called earwax that helps keep bacteria out of the ear and protects the skin in the ear canal. Occasionally, earwax can build up in the ear and cause discomfort or hearing loss. What increases the risk? This condition is more likely to develop in people who:  Are male.  Are elderly.  Naturally produce more earwax.  Clean their ears often with cotton swabs.  Use earplugs often.  Use in-ear headphones often.  Wear hearing aids.  Have narrow ear canals.  Have earwax that is overly thick or sticky.  Have eczema.  Are dehydrated.  Have excess hair in the ear canal.  What are the signs or symptoms? Symptoms of this condition include:  Reduced or muffled hearing.  A feeling of fullness in the ear or feeling that the ear is plugged.  Fluid coming from the ear.  Ear pain.  Ear itch.  Ringing in the ear.  Coughing.  An obvious piece of earwax that can be seen inside the ear canal.  How is this diagnosed? This  condition may be diagnosed based on:  Your symptoms.  Your medical history.  An ear exam. During the exam, your health care provider will look into your ear with an instrument called an otoscope.  You may have tests, including a hearing test. How is this treated? This condition may be treated by:  Using ear drops to soften the earwax.  Having the earwax removed by a health care provider. The health care provider may: ? Flush the ear with water. ? Use an instrument that has a loop on the end (curette). ? Use a suction device.  Surgery to remove the wax buildup. This may be done in severe cases.  Follow these  instructions at home:  Take over-the-counter and prescription medicines only as told by your health care provider.  Do not put any objects, including cotton swabs, into your ear. You can clean the opening of your ear canal with a washcloth or facial tissue.  Follow instructions from your health care provider about cleaning your ears. Do not over-clean your ears.  Drink enough fluid to keep your urine clear or pale yellow. This will help to thin the earwax.  Keep all follow-up visits as told by your health care provider. If earwax builds up in your ears often or if you use hearing aids, consider seeing your health care provider for routine, preventive ear cleanings. Ask your health care provider how often you should schedule your cleanings.  If you have hearing aids, clean them according to instructions from the manufacturer and your health care provider. Contact a health care provider if:  You have ear pain.  You develop a fever.  You have blood, pus, or other fluid coming from your ear.  You have hearing loss.  You have ringing in your ears that does not go away.  Your symptoms do not improve with treatment.  You feel like the room is spinning (vertigo). Summary  Earwax can build up in the ear and cause discomfort or hearing loss.  The most common symptoms of  this condition include reduced or muffled hearing and a feeling of fullness in the ear or feeling that the ear is plugged.  This condition may be diagnosed based on your symptoms, your medical history, and an ear exam.  This condition may be treated by using ear drops to soften the earwax or by having the earwax removed by a health care provider.  Do not put any objects, including cotton swabs, into your ear. You can clean the opening of your ear canal with a washcloth or facial tissue. This information is not intended to replace advice given to you by your health care provider. Make sure you discuss any questions you have with your health care provider. Document Released: 12/03/2004 Document Revised: 01/06/2017 Document Reviewed: 01/06/2017 Elsevier Interactive Patient Education  [employer].

## 2018-09-13 DIAGNOSIS — Z6822 Body mass index (BMI) 22.0-22.9, adult: Secondary | ICD-10-CM | POA: Diagnosis not present

## 2018-09-13 DIAGNOSIS — Z23 Encounter for immunization: Secondary | ICD-10-CM | POA: Diagnosis not present

## 2018-09-13 DIAGNOSIS — I10 Essential (primary) hypertension: Secondary | ICD-10-CM | POA: Diagnosis not present

## 2018-09-23 ENCOUNTER — Telehealth: Payer: Self-pay | Admitting: Emergency Medicine

## 2018-09-23 NOTE — Telephone Encounter (Signed)
Patient calling to check status of medications. Please advise. Patient upset that it has taken all day and still has not heard back.

## 2018-09-23 NOTE — Telephone Encounter (Signed)
Copied from CRM [id number]. Topic: General - Other >> Sep 23, 2018  7:15 AM Gerrianne ScalePayne, Angela L wrote: Reason for CRM: pt calling stating that he has a history of neck pain with Dr Cleta Albertsaub and states that he is having a flair up and wiuld like something called into his pharmacy for the pain    [id number] - [place], [place] - [phone] (Phone) [phone] (Fax) >> Sep 23, 2018  7:21 AM Gerrianne ScalePayne, Angela L wrote: Pt would like something for the pain and inflammation he stated that [facility] treated him for neck pain in 2017 please give pt a call back if any questions

## 2018-09-26 NOTE — Telephone Encounter (Signed)
Called pt to see if he would like to come in for an appointment. He was complaining of neck pain and inflammation. He stated that he had called on Saturday and could not get an appointment. He called the pharmacy and they recommended Ibuprofen, it helped to ease the pain "a little bit". He stated if it gets worse he will call during the week for an appointment. He stated that "he saw Dr. Cleta Albertsaub in 2017, for neck problem."

## 2018-11-01 DIAGNOSIS — E0500 Thyrotoxicosis with diffuse goiter without thyrotoxic crisis or storm: Secondary | ICD-10-CM | POA: Diagnosis not present

## 2018-11-24 ENCOUNTER — Telehealth: Payer: Self-pay | Admitting: *Deleted

## 2018-11-24 NOTE — Telephone Encounter (Signed)
Left detailed message to Schedule AWV.

## 2019-02-09 MED ORDER — CARBOXYMETHYLCELLULOSE SODIUM 0.5 % OP SOLN
2.00 | OPHTHALMIC | Status: DC
Start: ? — End: 2019-02-09

## 2019-02-09 MED ORDER — MELATONIN 3 MG PO TABS
6.00 | ORAL_TABLET | ORAL | Status: DC
Start: 2019-02-09 — End: 2019-02-09

## 2019-02-09 MED ORDER — CIPROFLOXACIN-DEXAMETHASONE 0.3-0.1 % OT SUSP
4.00 | OTIC | Status: DC
Start: 2019-02-09 — End: 2019-02-09

## 2019-02-09 MED ORDER — ENOXAPARIN SODIUM 30 MG/0.3ML ~~LOC~~ SOLN
30.00 | SUBCUTANEOUS | Status: DC
Start: 2019-02-09 — End: 2019-02-09

## 2019-02-09 MED ORDER — DERMACERIN EX
2.00 | CUTANEOUS | Status: DC
Start: ? — End: 2019-02-09

## 2019-02-09 MED ORDER — CHLORHEXIDINE GLUCONATE 0.12 % MT SOLN
15.00 | OROMUCOSAL | Status: DC
Start: 2019-02-09 — End: 2019-02-09

## 2019-02-09 MED ORDER — ACETAMINOPHEN 325 MG PO TABS
325.00 | ORAL_TABLET | ORAL | Status: DC
Start: 2019-02-09 — End: 2019-02-09

## 2019-02-09 MED ORDER — SG MINERAL OIL HEAVY PO
3.00 | ORAL | Status: DC
Start: ? — End: 2019-02-09

## 2019-02-09 MED ORDER — PHENYLEPHRINE-GUAIFENESIN 30-400 MG PO CP12
10.00 | ORAL_CAPSULE | ORAL | Status: DC
Start: 2019-02-10 — End: 2019-02-09

## 2019-02-09 MED ORDER — CEPHALEXIN 250 MG/5ML PO SUSR
500.00 | ORAL | Status: DC
Start: 2019-02-09 — End: 2019-02-09

## 2019-02-09 MED ORDER — SENNOSIDES-DOCUSATE SODIUM 8.6-50 MG PO TABS
2.00 | ORAL_TABLET | ORAL | Status: DC
Start: 2019-02-09 — End: 2019-02-09

## 2019-02-09 MED ORDER — DIHYDROTACHYSTEROL 0.125 MG PO TABS
20.00 | ORAL_TABLET | ORAL | Status: DC
Start: 2019-02-10 — End: 2019-02-09

## 2019-02-09 MED ORDER — HYDROCODONE-ACETAMINOPHEN 5-325 MG PO TABS
1.00 | ORAL_TABLET | ORAL | Status: DC
Start: ? — End: 2019-02-09

## 2019-02-09 MED ORDER — SALINE NASAL SPRAY 0.65 % NA SOLN
1.00 | NASAL | Status: DC
Start: 2019-02-09 — End: 2019-02-09

## 2019-03-10 ENCOUNTER — Ambulatory Visit: Payer: Self-pay

## 2019-03-10 NOTE — Telephone Encounter (Signed)
Pt. Called to report his BP is 136/63. He is concerned because he thinks "it's too low." His BP at last OV was the exact same reading. Denies any symptoms. States "I feel rel good." Will monitor BP and call back as needed.

## 2019-03-14 ENCOUNTER — Ambulatory Visit: Payer: Medicare Other | Admitting: Emergency Medicine

## 2019-07-10 ENCOUNTER — Telehealth: Payer: Self-pay | Admitting: Emergency Medicine

## 2019-07-10 NOTE — Telephone Encounter (Signed)
Py called and wanted to cancel appt because we dont tale his ins/ please verify pt insurance and make sure he really needs to cancel FR

## 2019-07-11 ENCOUNTER — Ambulatory Visit: Payer: Medicare Other | Admitting: Emergency Medicine

## 2019-09-28 ENCOUNTER — Other Ambulatory Visit: Payer: Self-pay | Admitting: Emergency Medicine

## 2019-09-28 DIAGNOSIS — I10 Essential (primary) hypertension: Secondary | ICD-10-CM

## 2019-09-28 NOTE — Telephone Encounter (Signed)
Requested medication (s) are due for refill today: yes  Requested medication (s) are on the active medication list: yes  Last refill: 05/20/2019  Future visit scheduled:no  Notes to clinic:  Overdue for office visit  Review for refill   Requested Prescriptions  Pending Prescriptions Disp Refills   amLODipine (NORVASC) 10 MG tablet [Pharmacy Med Name: AMLODIPINE BESYLATE 10MG  TABLETS] 90 tablet 3    Sig: TAKE 1 TABLET(10 MG) BY MOUTH DAILY     Cardiovascular:  Calcium Channel Blockers Failed - 09/28/2019 11:49 AM      Failed - Valid encounter within last 6 months    Recent Outpatient Visits          1 year ago Essential hypertension, benign   [facility], Ines Bloomer, MD   2 years ago Itching due to drug   [facility], Ines Bloomer, MD   2 years ago Essential hypertension, benign   [facility], Ines Bloomer, MD   2 years ago Essential hypertension, benign   [facility], Laurey Arrow, MD   3 years ago Essential hypertension   [facility], MD             Passed - Last BP in normal range    BP Readings from Last 1 Encounters:  09/12/18 136/63

## 2019-09-29 ENCOUNTER — Telehealth: Payer: Self-pay | Admitting: *Deleted

## 2019-09-29 ENCOUNTER — Other Ambulatory Visit: Payer: Self-pay | Admitting: Emergency Medicine

## 2019-09-29 DIAGNOSIS — I10 Essential (primary) hypertension: Secondary | ICD-10-CM

## 2019-09-29 NOTE — Telephone Encounter (Signed)
30 days prescription sent    Please schedule appointment

## 2019-09-29 NOTE — Telephone Encounter (Signed)
Requested medication (s) are due for refill today: yes  Requested medication (s) are on the active medication list: yes  Last refill:  09/29/2019  Future visit scheduled:no  Notes to clinic:  Requesting 90 day supply   Requested Prescriptions  Pending Prescriptions Disp Refills   amLODipine (NORVASC) 10 MG tablet [Pharmacy Med Name: AMLODIPINE BESYLATE 10MG  TABLETS] 90 tablet     Sig: TAKE 1 TABLET(10 MG) BY MOUTH DAILY     Cardiovascular:  Calcium Channel Blockers Failed - 09/29/2019 11:55 AM      Failed - Valid encounter within last 6 months    Recent Outpatient Visits          1 year ago Essential hypertension, benign   [facility], Ines Bloomer, MD   2 years ago Itching due to drug   [facility], Ines Bloomer, MD   2 years ago Essential hypertension, benign   [facility], Ines Bloomer, MD   2 years ago Essential hypertension, benign   [facility], Laurey Arrow, MD   3 years ago Essential hypertension   [facility], MD             Passed - Last BP in normal range    BP Readings from Last 1 Encounters:  09/12/18 136/63

## 2019-10-02 ENCOUNTER — Telehealth: Payer: Self-pay | Admitting: Emergency Medicine

## 2019-10-02 NOTE — Telephone Encounter (Signed)
FYI

## 2019-10-02 NOTE — Telephone Encounter (Signed)
No longer patient at [facility]

## 2019-10-02 NOTE — Telephone Encounter (Signed)
Pt is no longer being seen at PCP. We do not take his insurance any longer. Please do not send any more scripts in for him. He wants this known.
